# Patient Record
Sex: Female | Born: 1985
Health system: Southern US, Community
[De-identification: ages and names within clinical notes are randomized; demographics above are authoritative.]

## PROBLEM LIST (undated history)

## (undated) DIAGNOSIS — J45909 Unspecified asthma, uncomplicated: Secondary | ICD-10-CM

## (undated) HISTORY — PX: WISDOM TOOTH EXTRACTION: SHX21

---
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
## 2014-06-17 ENCOUNTER — Emergency Department: Payer: Self-pay | Admitting: Emergency Medicine

## 2014-10-14 ENCOUNTER — Emergency Department: Payer: 59

## 2014-10-14 ENCOUNTER — Emergency Department
Admission: EM | Admit: 2014-10-14 | Discharge: 2014-10-14 | Disposition: A | Discharge status: Home / Self Care | Payer: 59 | Attending: Emergency Medicine | Admitting: Emergency Medicine

## 2014-10-14 ENCOUNTER — Encounter: Payer: Self-pay | Admitting: Emergency Medicine

## 2014-10-14 DIAGNOSIS — Z79899 Other long term (current) drug therapy: Secondary | ICD-10-CM | POA: Diagnosis not present

## 2014-10-14 DIAGNOSIS — Z3A01 Less than 8 weeks gestation of pregnancy: Secondary | ICD-10-CM | POA: Insufficient documentation

## 2014-10-14 DIAGNOSIS — O200 Threatened abortion: Secondary | ICD-10-CM | POA: Insufficient documentation

## 2014-10-14 DIAGNOSIS — O209 Hemorrhage in early pregnancy, unspecified: Secondary | ICD-10-CM | POA: Diagnosis present

## 2014-10-14 LAB — URINALYSIS COMPLETE WITH MICROSCOPIC (ARMC ONLY)
BILIRUBIN URINE: NEGATIVE
GLUCOSE, UA: NEGATIVE mg/dL
Ketones, ur: NEGATIVE mg/dL
NITRITE: NEGATIVE
PH: 6 (ref 5.0–8.0)
Protein, ur: NEGATIVE mg/dL
SPECIFIC GRAVITY, URINE: 1.01 (ref 1.005–1.030)

## 2014-10-14 LAB — HCG, QUANTITATIVE, PREGNANCY: HCG, BETA CHAIN, QUANT, S: 26843 m[IU]/mL — AB (ref <5)

## 2014-10-14 NOTE — Discharge Instructions (Signed)
Vaginal Bleeding During Pregnancy, First Trimester  A small amount of bleeding (spotting) from the vagina is relatively common in early pregnancy. It usually stops on its own. Various things may cause bleeding or spotting in early pregnancy. Some bleeding may be related to the pregnancy, and some may not. In most cases, the bleeding is normal and is not a problem. However, bleeding can also be a sign of something serious. Be sure to tell your health care provider about any vaginal bleeding right away.  Some possible causes of vaginal bleeding during the first trimester include:  · Infection or inflammation of the cervix.  · Growths (polyps) on the cervix.  · Miscarriage or threatened miscarriage.  · Pregnancy tissue has developed outside of the uterus and in a fallopian tube (tubal pregnancy).  · Tiny cysts have developed in the uterus instead of pregnancy tissue (molar pregnancy).  HOME CARE INSTRUCTIONS   Watch your condition for any changes. The following actions may help to lessen any discomfort you are feeling:  · Follow your health care provider's instructions for limiting your activity. If your health care provider orders bed rest, you may need to stay in bed and only get up to use the bathroom. However, your health care provider may allow you to continue light activity.  · If needed, make plans for someone to help with your regular activities and responsibilities while you are on bed rest.  · Keep track of the number of pads you use each day, how often you change pads, and how soaked (saturated) they are. Write this down.  · Do not use tampons. Do not douche.  · Do not have sexual intercourse or orgasms until approved by your health care provider.  · If you pass any tissue from your vagina, save the tissue so you can show it to your health care provider.  · Only take over-the-counter or prescription medicines as directed by your health care provider.  · Do not take aspirin because it can make you  bleed.  · Keep all follow-up appointments as directed by your health care provider.  SEEK MEDICAL CARE IF:  · You have any vaginal bleeding during any part of your pregnancy.  · You have cramps or labor pains.  · You have a fever, not controlled by medicine.  SEEK IMMEDIATE MEDICAL CARE IF:   · You have severe cramps in your back or belly (abdomen).  · You pass large clots or tissue from your vagina.  · Your bleeding increases.  · You feel light-headed or weak, or you have fainting episodes.  · You have chills.  · You are leaking fluid or have a gush of fluid from your vagina.  · You pass out while having a bowel movement.  MAKE SURE YOU:  · Understand these instructions.  · Will watch your condition.  · Will get help right away if you are not doing well or get worse.  Document Released: 12/23/2004 Document Revised: 03/20/2013 Document Reviewed: 11/20/2012  ExitCare® Patient Information ©2015 [employer]. This information is not intended to replace advice given to you by your health care provider. Make sure you discuss any questions you have with your health care provider.

## 2014-10-14 NOTE — ED Notes (Signed)
MD at bedside. 

## 2014-10-14 NOTE — ED Notes (Signed)
Says she is 6 or [redacted] weeks pregnant and has had brownish spotting since yesterday.  Had miscarriage in march.

## 2014-10-14 NOTE — ED Provider Notes (Signed)
[facility] Emergency Department Provider Note  ____________________________________________  Time seen: 8:30 PM  I have reviewed the triage vital signs and the nursing notes.   HISTORY  Chief Complaint Vaginal Bleeding     HPI Anna Callahan is a 29 y.o. female presents with history of being approximately 6 or [redacted] weeks pregnant with painless brownish vaginal spotting times one day. Patient is G2 with one previous miscarriage March 2016. Patient denies any pelvic pain no nausea no vomiting and no fever      History reviewed. No pertinent past medical history.  There are no active problems to display for this patient.   No past surgical history on file.  Current Outpatient Rx  Name  Route  Sig  Dispense  Refill  . loratadine (CLARITIN) 10 MG tablet   Oral   Take 10 mg by mouth daily.         . montelukast (SINGULAIR) 10 MG tablet   Oral   Take 10 mg by mouth at bedtime.           Allergies  no known drug allergies   Social History History  Substance Use Topics  . Smoking status: Never Smoker   . Smokeless tobacco: Not on file  . Alcohol Use: No    Review of Systems  Constitutional: Negative for fever. Eyes: Negative for visual changes. ENT: Negative for sore throat. Cardiovascular: Negative for chest pain. Respiratory: Negative for shortness of breath. Gastrointestinal: Negative for abdominal pain, vomiting and diarrhea. Genitourinary: Negative for dysuria. positive for vaginal spotting  Musculoskeletal: Negative for back pain. Skin: Negative for rash. Neurological: Negative for headaches, focal weakness or numbness.   10-point ROS otherwise negative.  ____________________________________________   PHYSICAL EXAM:  VITAL SIGNS: ED Triage Vitals  Enc Vitals Group     BP 10/14/14 1847 117/76 mmHg     Pulse Rate 10/14/14 1847 63     Resp 10/14/14 1847 14     Temp 10/14/14 1847 98.2 F (36.8 C)     Temp Source  10/14/14 1847 Oral     SpO2 10/14/14 1847 99 %     Weight 10/14/14 1847 140 lb (63.504 kg)     Height 10/14/14 1847  (1.575 m)     Head Cir --      Peak Flow --      Pain Score --      Pain Loc --      Pain Edu? --      Excl. in GC? --    Constitutional: Alert and oriented. Well appearing and in no distress. Eyes: Conjunctivae are normal. PERRL. Normal extraocular movements. ENT   Head: Normocephalic and atraumatic.   Nose: No congestion/rhinnorhea.   Mouth/Throat: Mucous membranes are moist.   Neck: No stridor. Hematological/Lymphatic/Immunilogical: No cervical lymphadenopathy. Cardiovascular: Normal rate, regular rhythm. Normal and symmetric distal pulses are present in all extremities. No murmurs, rubs, or gallops. Respiratory: Normal respiratory effort without tachypnea nor retractions. Breath sounds are clear and equal bilaterally. No wheezes/rales/rhonchi. Gastrointestinal: Soft and nontender. No distention. There is no CVA tenderness. Genitourinary: deferred Musculoskeletal: Nontender with normal range of motion in all extremities. No joint effusions.  No lower extremity tenderness nor edema. Neurologic:  Normal speech and language. No gross focal neurologic deficits are appreciated. Speech is normal.  Skin:  Skin is warm, dry and intact. No rash noted. Psychiatric: Mood and affect are normal. Speech and behavior are normal. Patient exhibits appropriate insight and judgment.  ____________________________________________  LABS (pertinent positives/negatives)  Labs Reviewed  HCG, QUANTITATIVE, PREGNANCY - Abnormal; Notable for the following:    hCG, Beta Francene FindersChain, Quant, S [id number] (*)    All other components within normal limits  URINALYSIS COMPLETEWITH MICROSCOPIC ([facility] ONLY) - Abnormal; Notable for the following:    Color, Urine STRAW (*)    APPearance CLEAR (*)    Hgb urine dipstick 2+ (*)    Leukocytes, UA 1+ (*)    Bacteria, UA RARE (*)    Squamous  Epithelial / LPF 0-5 (*)    All other components within normal limits         RADIOLOGY  OB ultrasound revealed:   IMPRESSION: Single live IUP with estimated gestational age 109 weeks 0 days.   Electronically Signed By: Elberta Fortisaniel Boyle M.D. On: 10/14/2014 22:26        INITIAL IMPRESSION / ASSESSMENT AND PLAN / ED COURSE  Pertinent labs & imaging results that were available during my care of the patient were reviewed by me and considered in my medical decision making (see chart for details). history of physical exam concerning for vaginal spotting in the first trimester. Patient advised of possibility of early miscarriage as such with recommendation to follow-up with her GYN as planned on Thursday. ______________________________________   FINAL CLINICAL IMPRESSION(S) / ED DIAGNOSES  Final diagnoses:  Threatened miscarriage in early pregnancy  Vaginal bleeding in pregnancy, first trimester      Darci Currentandolph N Brown, MD 10/14/14 2239

## 2014-10-15 ENCOUNTER — Telehealth: Payer: Self-pay | Admitting: Emergency Medicine

## 2014-10-17 LAB — OB RESULTS CONSOLE GC/CHLAMYDIA
Chlamydia: NEGATIVE
Gonorrhea: NEGATIVE

## 2014-11-27 LAB — OB RESULTS CONSOLE ABO/RH: RH TYPE: POSITIVE

## 2014-11-27 LAB — OB RESULTS CONSOLE HIV ANTIBODY (ROUTINE TESTING): HIV: NONREACTIVE

## 2014-11-27 LAB — OB RESULTS CONSOLE RUBELLA ANTIBODY, IGM: RUBELLA: IMMUNE

## 2014-11-27 LAB — OB RESULTS CONSOLE RPR: RPR: NONREACTIVE

## 2014-11-27 LAB — OB RESULTS CONSOLE ANTIBODY SCREEN: ANTIBODY SCREEN: NEGATIVE

## 2014-11-27 LAB — OB RESULTS CONSOLE HEPATITIS B SURFACE ANTIGEN: Hepatitis B Surface Ag: NEGATIVE

## 2015-01-29 ENCOUNTER — Telehealth: Payer: Self-pay

## 2015-01-29 MED ORDER — MONTELUKAST SODIUM 10 MG PO TABS: 10.0000 mg | ORAL_TABLET | Freq: Every day | ORAL | Status: DC

## 2015-01-29 NOTE — Telephone Encounter (Signed)
Sent in 30 day supply of montelukast 10mg  with no additional refills. Called and left voicemail with patient to return call to notify of refill sent in.

## 2015-01-29 NOTE — Telephone Encounter (Signed)
PATIENT CALLED TO MAKE AN APT AND TO ALSO GET A REFILL ON MONTELUKAST. SHE HAS RAN OUT OF REFILLS AND WOULD LIKE ENOUGH TO HOLD HER OVER TO HER APT. DOL VISIT 10-03-2014 WITH DR HICKS [facility].  PLEASE ADVISE THANKS

## 2015-01-29 NOTE — Telephone Encounter (Signed)
Patient returned phone call and was notified of refill .

## 2015-03-06 ENCOUNTER — Ambulatory Visit: Payer: 59 | Admitting: Allergy and Immunology

## 2015-03-06 ENCOUNTER — Encounter: Payer: Self-pay | Admitting: Allergy and Immunology

## 2015-03-06 VITALS — BP 106/60 | HR 76 | Resp 16

## 2015-03-06 DIAGNOSIS — H1010 Acute atopic conjunctivitis, unspecified eye: Secondary | ICD-10-CM | POA: Diagnosis not present

## 2015-03-06 DIAGNOSIS — J309 Allergic rhinitis, unspecified: Secondary | ICD-10-CM | POA: Diagnosis not present

## 2015-03-06 DIAGNOSIS — J4520 Mild intermittent asthma, uncomplicated: Secondary | ICD-10-CM | POA: Diagnosis not present

## 2015-03-06 MED ORDER — ALBUTEROL SULFATE HFA 108 (90 BASE) MCG/ACT IN AERS: 2.0000 | INHALATION_SPRAY | RESPIRATORY_TRACT | Status: DC | PRN

## 2015-03-06 MED ORDER — MONTELUKAST SODIUM 10 MG PO TABS: 10.0000 mg | ORAL_TABLET | Freq: Every day | ORAL | Status: DC

## 2015-03-06 NOTE — Patient Instructions (Signed)
   Continue Singulair 10 mg each evening.  Continue Claritin 10 mg once daily.  Begin saline nasal wash 2-4 times daily.  As needed ProAir HFA for cough or wheeze 2 puffs every 4-6 hours.  If persisting nasal congestion/PND review with OB adding Rhinocort 1-2 sprays once daily.

## 2015-03-06 NOTE — Progress Notes (Signed)
FOLLOW UP NOTE  RE: Anna Callahan MRN: [id number] DOB: April 26, 1985 [facility] [place] [address] [place] [id number] Date of Office Visit: 03/06/2015  Subjective:  Anna Callahan is a 29 y.o. female who presents today for Medication Refill  Assessment:   1. Allergic rhinoconjunctivitis   2. Mild intermittent asthma.  3.      Pregnancy 27 weeks. Plan:   Meds ordered this encounter  Medications  . montelukast (SINGULAIR) 10 MG tablet    Sig: Take 1 tablet (10 mg total) by mouth at bedtime.    Dispense:  30 tablet    Refill:  5  . albuterol (PROAIR HFA) 108 (90 BASE) MCG/ACT inhaler    Sig: Inhale 2 puffs into the lungs every 4 (four) hours as needed for wheezing or shortness of breath.    Dispense:  1 Inhaler    Refill:  1   Patient Instructions  1.  Continue Singulair 10 mg each evening. 2.  Continue Claritin 10 mg once daily. 3.  Begin saline nasal wash 2-4 times daily. 4.  As needed ProAir HFA for cough or wheeze 2 puffs every 4-6 hours. 5.  If persisting nasal congestion/PND review with OB adding Rhinocort 1-2 sprays once daily. 6.  Follow-up in 4 months or sooner if needed.  HPI: Anna Burtonmily returns to the office in follow-up of allergic rhinoconjunctivitis and previous history of cough and wheeze.  Since her last visit in July she reports doing well with her pregnancy and maintaining on preventive regime, as the OB is in agreement with these medications.  She had an upper respiratory infection approximately 3 weeks ago with significant rhinorrhea, congestion, postnasal drip, slight cough, then she used Sudafed and now improved.  She does note intermittent postnasal drip, but no discolored drainage, headache, sore throat or fever.  She feels her breathing is good and only used [employer] Air once as related to heavy dog exposure.  She is exercising regularly including occasional jogging. Denies ED or urgent care visits,  prednisone or antibiotic courses.  She is requesting refill of medications today given their benefit.  Current Medications: 1.  Singulair 10 mg once daily. 2.  Claritin 10 mg once daily. 3.  ProAir HFA as needed.   Drug Allergies: No Known Allergies  Objective:   Filed Vitals:   03/06/15 1725  BP: 106/60  Pulse: 76  Resp: 16   Physical Exam  Constitutional: She is well-developed, well-nourished, and in no distress.  HENT:  Head: Atraumatic.  Right Ear: Tympanic membrane and ear canal normal.  Left Ear: Tympanic membrane and ear canal normal.  Nose: Mucosal edema present. No rhinorrhea. No epistaxis.  Mouth/Throat: Oropharynx is clear and moist and mucous membranes are normal. No oropharyngeal exudate, posterior oropharyngeal edema or posterior oropharyngeal erythema.  Neck: Neck supple.  Cardiovascular: Normal rate, S1 normal and S2 normal.   No murmur heard. Pulmonary/Chest: Effort normal. She has no wheezes. She has no rhonchi. She has no rales.  Lymphadenopathy:    She has no cervical adenopathy.   Diagnostics: none    Anna Callahan M. Willa RoughHicks, MD  cc:  [facility]

## 2015-03-30 NOTE — L&D Delivery Note (Signed)
Patient was C/C/+1 and pushed for 2hr 30 minutes with epidural.   NSVD  female infant, Apgars 8/9, weight pending.   The patient had a right vaginal laceration repaired with 2-0 vicryl. Fundus was firm. EBL was expected amount. Placenta was delivered intact. Vagina was clear.  Baby was vigorous and doing skin to skin with mother.  Philip AspenALLAHAN, Emmery Seiler

## 2015-04-17 MED FILL — MONTELUKAST SOD 10 MG TAB: 10 | 90 days supply | Qty: 90 | Fill #1

## 2015-05-13 DIAGNOSIS — Z3480 Encounter for supervision of other normal pregnancy, unspecified trimester: Secondary | ICD-10-CM | POA: Diagnosis not present

## 2015-05-13 LAB — OB RESULTS CONSOLE GBS: GBS: POSITIVE

## 2015-06-02 DIAGNOSIS — O368131 Decreased fetal movements, third trimester, fetus 1: Secondary | ICD-10-CM | POA: Diagnosis not present

## 2015-06-07 ENCOUNTER — Encounter (HOSPITAL_COMMUNITY): Payer: Self-pay | Admitting: *Deleted

## 2015-06-07 ENCOUNTER — Inpatient Hospital Stay (HOSPITAL_COMMUNITY)
Admission: AD | Admit: 2015-06-07 | Discharge: 2015-06-10 | DRG: 775 | Disposition: A | Discharge status: Home / Self Care | Payer: 59 | Source: Ambulatory Visit | Attending: Obstetrics and Gynecology | Admitting: Obstetrics and Gynecology

## 2015-06-07 DIAGNOSIS — J45909 Unspecified asthma, uncomplicated: Secondary | ICD-10-CM | POA: Diagnosis present

## 2015-06-07 DIAGNOSIS — Z3A39 39 weeks gestation of pregnancy: Secondary | ICD-10-CM

## 2015-06-07 DIAGNOSIS — O99824 Streptococcus B carrier state complicating childbirth: Principal | ICD-10-CM | POA: Diagnosis present

## 2015-06-07 HISTORY — DX: Unspecified asthma, uncomplicated: J45.909

## 2015-06-07 NOTE — MAU Note (Signed)
Contractions since Tuesday. Yesterday and today about 9mins apart. NOw 4-795mins apart for last several hours. Denies LOF or bleeding.

## 2015-06-08 ENCOUNTER — Inpatient Hospital Stay (HOSPITAL_COMMUNITY): Payer: 59 | Admitting: Anesthesiology

## 2015-06-08 ENCOUNTER — Encounter (HOSPITAL_COMMUNITY): Payer: Self-pay

## 2015-06-08 DIAGNOSIS — O99824 Streptococcus B carrier state complicating childbirth: Secondary | ICD-10-CM | POA: Diagnosis present

## 2015-06-08 DIAGNOSIS — J45909 Unspecified asthma, uncomplicated: Secondary | ICD-10-CM | POA: Diagnosis present

## 2015-06-08 DIAGNOSIS — Z3A39 39 weeks gestation of pregnancy: Secondary | ICD-10-CM | POA: Diagnosis not present

## 2015-06-08 LAB — CBC
HEMATOCRIT: 35.4 % — AB (ref 36.0–46.0)
Hemoglobin: 12 g/dL (ref 12.0–15.0)
MCH: 28.9 pg (ref 26.0–34.0)
MCHC: 33.9 g/dL (ref 30.0–36.0)
MCV: 85.3 fL (ref 78.0–100.0)
Platelets: 244 10*3/uL (ref 150–400)
RBC: 4.15 MIL/uL (ref 3.87–5.11)
RDW: 13.6 % (ref 11.5–15.5)
WBC: 15.1 10*3/uL — AB (ref 4.0–10.5)

## 2015-06-08 LAB — TYPE AND SCREEN
ABO/RH(D): A POS
ANTIBODY SCREEN: NEGATIVE

## 2015-06-08 LAB — RPR: RPR Ser Ql: NONREACTIVE

## 2015-06-08 LAB — ABO/RH: ABO/RH(D): A POS

## 2015-06-08 MED ORDER — LACTATED RINGERS IV SOLN
500.0000 mL | INTRAVENOUS | Status: DC | PRN
Start: 2015-06-08 — End: 2015-06-08

## 2015-06-08 MED ORDER — IBUPROFEN 600 MG PO TABS
600.0000 mg | ORAL_TABLET | Freq: Four times a day (QID) | ORAL | Status: DC
  Administered 2015-06-09 – 2015-06-10 (×6): 600 mg via ORAL
  Filled 2015-06-08 (×6): qty 1

## 2015-06-08 MED ORDER — PHENYLEPHRINE 40 MCG/ML (10ML) SYRINGE FOR IV PUSH (FOR BLOOD PRESSURE SUPPORT)
80.0000 ug | PREFILLED_SYRINGE | INTRAVENOUS | Status: DC | PRN
  Filled 2015-06-08: qty 20
  Filled 2015-06-08: qty 2

## 2015-06-08 MED ORDER — LACTATED RINGERS IV SOLN
INTRAVENOUS | Status: DC
Start: 2015-06-08 — End: 2015-06-08
  Administered 2015-06-08: 500 mL via INTRAVENOUS
  Administered 2015-06-08 (×2): via INTRAVENOUS

## 2015-06-08 MED ORDER — OXYTOCIN 10 UNIT/ML IJ SOLN
2.5000 [IU]/h | INTRAVENOUS | Status: DC
  Filled 2015-06-08: qty 10

## 2015-06-08 MED ORDER — FLEET ENEMA 7-19 GM/118ML RE ENEM: 1.0000 | ENEMA | RECTAL | Status: DC | PRN

## 2015-06-08 MED ORDER — PRENATAL MULTIVITAMIN CH
1.0000 | ORAL_TABLET | Freq: Every day | ORAL | Status: DC
  Administered 2015-06-09: 1 via ORAL
  Filled 2015-06-08: qty 1

## 2015-06-08 MED ORDER — DIPHENHYDRAMINE HCL 25 MG PO CAPS: 25.0000 mg | ORAL_CAPSULE | Freq: Four times a day (QID) | ORAL | Status: DC | PRN

## 2015-06-08 MED ORDER — ACETAMINOPHEN 325 MG PO TABS: 650.0000 mg | ORAL_TABLET | ORAL | Status: DC | PRN

## 2015-06-08 MED ORDER — ONDANSETRON HCL 4 MG/2ML IJ SOLN
4.0000 mg | Freq: Four times a day (QID) | INTRAMUSCULAR | Status: DC | PRN
  Administered 2015-06-08: 4 mg via INTRAVENOUS
  Filled 2015-06-08: qty 2

## 2015-06-08 MED ORDER — OXYCODONE-ACETAMINOPHEN 5-325 MG PO TABS: 1.0000 | ORAL_TABLET | ORAL | Status: DC | PRN

## 2015-06-08 MED ORDER — EPHEDRINE 5 MG/ML INJ
10.0000 mg | INTRAVENOUS | Status: DC | PRN
  Filled 2015-06-08: qty 2

## 2015-06-08 MED ORDER — LACTATED RINGERS IV SOLN: 500.0000 mL | Freq: Once | INTRAVENOUS | Status: DC

## 2015-06-08 MED ORDER — ONDANSETRON HCL 4 MG/2ML IJ SOLN: 4.0000 mg | INTRAMUSCULAR | Status: DC | PRN

## 2015-06-08 MED ORDER — PHENYLEPHRINE 40 MCG/ML (10ML) SYRINGE FOR IV PUSH (FOR BLOOD PRESSURE SUPPORT)
80.0000 ug | PREFILLED_SYRINGE | INTRAVENOUS | Status: DC | PRN
  Filled 2015-06-08: qty 2

## 2015-06-08 MED ORDER — BENZOCAINE-MENTHOL 20-0.5 % EX AERO
1.0000 "application " | INHALATION_SPRAY | CUTANEOUS | Status: DC | PRN
  Administered 2015-06-09: 1 via TOPICAL
  Filled 2015-06-08: qty 56

## 2015-06-08 MED ORDER — PENICILLIN G POTASSIUM 5000000 UNITS IJ SOLR
2.5000 10*6.[IU] | INTRAVENOUS | Status: DC
  Administered 2015-06-08 (×3): 2.5 10*6.[IU] via INTRAVENOUS
  Filled 2015-06-08 (×5): qty 2.5

## 2015-06-08 MED ORDER — PENICILLIN G POTASSIUM 5000000 UNITS IJ SOLR
5.0000 10*6.[IU] | Freq: Once | INTRAVENOUS | Status: AC
  Administered 2015-06-08: 5 10*6.[IU] via INTRAVENOUS
  Filled 2015-06-08: qty 5

## 2015-06-08 MED ORDER — OXYCODONE-ACETAMINOPHEN 5-325 MG PO TABS: 2.0000 | ORAL_TABLET | ORAL | Status: DC | PRN

## 2015-06-08 MED ORDER — LIDOCAINE HCL (PF) 1 % IJ SOLN
30.0000 mL | INTRAMUSCULAR | Status: DC | PRN
  Administered 2015-06-08: 30 mL via SUBCUTANEOUS
  Filled 2015-06-08: qty 30

## 2015-06-08 MED ORDER — OXYTOCIN BOLUS FROM INFUSION
500.0000 mL | INTRAVENOUS | Status: DC
Start: 2015-06-08 — End: 2015-06-08
  Administered 2015-06-08: 500 mL via INTRAVENOUS

## 2015-06-08 MED ORDER — CITRIC ACID-SODIUM CITRATE 334-500 MG/5ML PO SOLN: 30.0000 mL | ORAL | Status: DC | PRN

## 2015-06-08 MED ORDER — ZOLPIDEM TARTRATE 5 MG PO TABS: 5.0000 mg | ORAL_TABLET | Freq: Every evening | ORAL | Status: DC | PRN

## 2015-06-08 MED ORDER — DIBUCAINE 1 % RE OINT
1.0000 "application " | TOPICAL_OINTMENT | RECTAL | Status: DC | PRN
  Administered 2015-06-09: 1 via RECTAL
  Filled 2015-06-08: qty 28

## 2015-06-08 MED ORDER — LIDOCAINE HCL (PF) 1 % IJ SOLN
INTRAMUSCULAR | Status: DC | PRN
  Administered 2015-06-08: 5 mL via EPIDURAL
  Administered 2015-06-08: 3 mL via EPIDURAL
  Administered 2015-06-08: 2 mL via EPIDURAL

## 2015-06-08 MED ORDER — WITCH HAZEL-GLYCERIN EX PADS
1.0000 "application " | MEDICATED_PAD | CUTANEOUS | Status: DC | PRN
  Administered 2015-06-09: 1 via TOPICAL

## 2015-06-08 MED ORDER — SENNOSIDES-DOCUSATE SODIUM 8.6-50 MG PO TABS
2.0000 | ORAL_TABLET | ORAL | Status: DC
  Administered 2015-06-09 (×2): 2 via ORAL
  Filled 2015-06-08 (×2): qty 2

## 2015-06-08 MED ORDER — SIMETHICONE 80 MG PO CHEW: 80.0000 mg | CHEWABLE_TABLET | ORAL | Status: DC | PRN

## 2015-06-08 MED ORDER — ONDANSETRON HCL 4 MG PO TABS: 4.0000 mg | ORAL_TABLET | ORAL | Status: DC | PRN

## 2015-06-08 MED ORDER — TETANUS-DIPHTH-ACELL PERTUSSIS 5-2.5-18.5 LF-MCG/0.5 IM SUSP: 0.5000 mL | Freq: Once | INTRAMUSCULAR | Status: DC

## 2015-06-08 MED ORDER — DIPHENHYDRAMINE HCL 50 MG/ML IJ SOLN: 12.5000 mg | INTRAMUSCULAR | Status: DC | PRN

## 2015-06-08 MED ORDER — FENTANYL 2.5 MCG/ML BUPIVACAINE 1/10 % EPIDURAL INFUSION (WH - ANES)
14.0000 mL/h | INTRAMUSCULAR | Status: DC
  Administered 2015-06-08 (×2): 14 mL/h via EPIDURAL
  Filled 2015-06-08 (×2): qty 125

## 2015-06-08 MED ORDER — LANOLIN HYDROUS EX OINT: TOPICAL_OINTMENT | CUTANEOUS | Status: DC | PRN

## 2015-06-08 NOTE — Anesthesia Preprocedure Evaluation (Signed)
Anesthesia Evaluation  Patient identified by MRN, date of birth, ID band Patient awake    Reviewed: Allergy & Precautions, NPO status , Patient's Chart, lab work & pertinent test results  History of Anesthesia Complications Negative for: history of anesthetic complications  Airway Mallampati: II  TM Distance: >3 FB Neck ROM: Full    Dental  (+) Teeth Intact, Dental Advisory Given   Pulmonary asthma ,    Pulmonary exam normal breath sounds clear to auscultation       Cardiovascular negative cardio ROS Normal cardiovascular exam Rhythm:Regular Rate:Normal     Neuro/Psych negative neurological ROS     GI/Hepatic negative GI ROS, Neg liver ROS,   Endo/Other  Obesity   Renal/GU negative Renal ROS     Musculoskeletal negative musculoskeletal ROS (+)   Abdominal   Peds  Hematology negative hematology ROS (+) Plt 244k   Anesthesia Other Findings Day of surgery medications reviewed with the patient.  Reproductive/Obstetrics (+) Pregnancy                             Anesthesia Physical Anesthesia Plan  ASA: II  Anesthesia Plan: Epidural   Post-op Pain Management:    Induction:   Airway Management Planned:   Additional Equipment:   Intra-op Plan:   Post-operative Plan:   Informed Consent: I have reviewed the patients History and Physical, chart, labs and discussed the procedure including the risks, benefits and alternatives for the proposed anesthesia with the patient or authorized representative who has indicated his/her understanding and acceptance.   Dental advisory given  Plan Discussed with:   Anesthesia Plan Comments: (Patient identified. Risks/Benefits/Options discussed with patient including but not limited to bleeding, infection, nerve damage, paralysis, failed block, incomplete pain control, headache, blood pressure changes, nausea, vomiting, reactions to medication both  or allergic, itching and postpartum back pain. Confirmed with bedside nurse the patient's most recent platelet count. Confirmed with patient that they are not currently taking any anticoagulation, have any bleeding history or any family history of bleeding disorders. Patient expressed understanding and wished to proceed. All questions were answered. )        Anesthesia Quick Evaluation

## 2015-06-08 NOTE — Progress Notes (Signed)
Pt to BS via w/c 

## 2015-06-08 NOTE — Progress Notes (Signed)
Dr Claiborne Billingsallahan notified of pt's admission and status. Aware of ctx pattern, sve, fhr reactive with variables. [facility]

## 2015-06-08 NOTE — Progress Notes (Signed)
Pt up to BR

## 2015-06-08 NOTE — Progress Notes (Signed)
To BS 

## 2015-06-08 NOTE — Anesthesia Procedure Notes (Signed)
Epidural Patient location during procedure: OB  Staffing Anesthesiologist: TURK, STEPHEN EDWARD Performed by: anesthesiologist   Preanesthetic Checklist Completed: patient identified, pre-op evaluation, timeout performed, IV checked, risks and benefits discussed and monitors and equipment checked  Epidural Patient position: sitting Prep: DuraPrep Patient monitoring: blood pressure and continuous pulse ox Approach: midline Location: L3-L4 Injection technique: LOR air  Needle:  Needle type: Tuohy  Needle gauge: 17 G Needle length: 9 cm Needle insertion depth: 6 cm Catheter size: 19 Gauge Catheter at skin depth: 11 cm Test dose: negative and Other (1% Lidocaine)  Additional Notes Patient identified.  Risk benefits discussed including failed block, incomplete pain control, headache, nerve damage, paralysis, blood pressure changes, nausea, vomiting, reactions to medication both toxic or allergic, and postpartum back pain.  Patient expressed understanding and wished to proceed.  All questions were answered.  Sterile technique used throughout procedure and epidural site dressed with sterile barrier dressing. No paresthesia or other complications noted. The patient did not experience any signs of intravascular injection such as tinnitus or metallic taste in mouth nor signs of intrathecal spread such as rapid motor block. Please see nursing notes for vital signs. Reason for block:procedure for pain   

## 2015-06-08 NOTE — H&P (Signed)
30 y.o. 3125w6d  G1P0 comes in c/o labor.  Otherwise has good fetal movement and no bleeding.  Past Medical History  Diagnosis Date  . Asthma     Past Surgical History  Procedure Laterality Date  . No past surgeries    . Wisdom tooth extraction Bilateral     OB History  Gravida Para Term Preterm AB SAB TAB Ectopic Multiple Living  1             # Outcome Date GA Lbr Len/2nd Weight Sex Delivery Anes PTL Lv  1 Current               Social History   Social History  . Marital Status: Married    Spouse Name: N/A  . Number of Children: N/A  . Years of Education: N/A   Occupational History  . Not on file.   Social History Main Topics  . Smoking status: Never Smoker   . Smokeless tobacco: Not on file  . Alcohol Use: No  . Drug Use: No  . Sexual Activity: Not on file   Other Topics Concern  . Not on file   Social History Narrative   Review of patient's allergies indicates no known allergies.    Prenatal Transfer Tool  Maternal Diabetes: No Genetic Screening: Normal, only AFP done Maternal Ultrasounds/Referrals: Normal Fetal Ultrasounds or other Referrals:  None Maternal Substance Abuse:  No Significant Maternal Medications:  None Significant Maternal Lab Results: Lab values include: Group B Strep positive  Other PNC: uncomplicated.    Filed Vitals:   06/08/15 1030 06/08/15 1101  BP: 125/71 112/67  Pulse: 70 81  Temp:    Resp: 17 16     Lungs/Cor:  NAD Abdomen:  soft, gravid Ex:  no cords, erythema SVE:  4.5/100/-2 FHTs:  125, good STV, NST R Toco:  3-7   A/P   Admitted in labor   GBS Pos - PCN  AROM - clear fluid  Will recheck 2 hrs and add pitocin augmentation prn  Larsen Zettel

## 2015-06-08 NOTE — Progress Notes (Signed)
No epidural at this time, ctx getting more frequent FHT 135 Cat 1 TOCO q2-3 SVE: unchanged Discussed with RN can monitor, if ctx space out start pit 2x2 Epidural ok when desired

## 2015-06-09 LAB — CBC
HEMATOCRIT: 32.1 % — AB (ref 36.0–46.0)
HEMOGLOBIN: 10.7 g/dL — AB (ref 12.0–15.0)
MCH: 28.5 pg (ref 26.0–34.0)
MCHC: 33.3 g/dL (ref 30.0–36.0)
MCV: 85.4 fL (ref 78.0–100.0)
Platelets: 224 10*3/uL (ref 150–400)
RBC: 3.76 MIL/uL — ABNORMAL LOW (ref 3.87–5.11)
RDW: 13.9 % (ref 11.5–15.5)
WBC: 20.6 10*3/uL — AB (ref 4.0–10.5)

## 2015-06-09 NOTE — Anesthesia Postprocedure Evaluation (Signed)
Anesthesia Post Note  Patient: Anna Callahan  Procedure(s) Performed: * No procedures listed *  Patient location during evaluation: Mother Baby Anesthesia Type: Epidural Level of consciousness: awake and alert Pain management: pain level controlled Vital Signs Assessment: post-procedure vital signs reviewed and stable Respiratory status: spontaneous breathing, nonlabored ventilation and respiratory function stable Cardiovascular status: stable Postop Assessment: no headache, no backache and epidural receding Anesthetic complications: no    Last Vitals:  Filed Vitals:   06/09/15 0045 06/09/15 0445  BP: 115/57 103/53  Pulse: 87 72  Temp: 37.1 C 37.1 C  Resp: 18 18    Last Pain:  Filed Vitals:   06/09/15 0613  PainSc: 0-No pain                 Diego Delancey

## 2015-06-09 NOTE — Progress Notes (Signed)
Post Partum Day 1 Subjective: no complaints, up ad lib, voiding and tolerating PO  Objective: Blood pressure 103/53, pulse 72, temperature 98.7 F (37.1 C), temperature source Oral, resp. rate 18, height 5\' 3"  (1.6 m), weight 87.998 kg (194 lb), last menstrual period 08/25/2014, SpO2 97 %, unknown if currently breastfeeding.  Physical Exam:  General: alert, cooperative and appears stated age Lochia: appropriate Uterine Fundus: firm   Recent Labs  06/08/15 0100 06/09/15 0546  HGB 12.0 10.7*  HCT 35.4* 32.1*    Assessment/Plan: Plan for discharge tomorrow and Breastfeeding   LOS: 1 day   Ladona Rosten H. 06/09/2015, 9:37 AM

## 2015-06-09 NOTE — Lactation Note (Signed)
This note was copied from a baby's chart. Lactation Consultation Note  Patient Name: Girl Harriet Buttemily Loughridge ZOXWR'UToday's Date: 06/09/2015 Reason for consult: Follow-up assessment Baby at 19 hr of life and mom is still having nipple pain. She thinks the bruising is getting worse. L nipple has a small scab at 2 o'clock and the tip is bruised. The R nipple has a small scab at 10 o'clock and the tip is bruised. Baby has a somewhat high palate, slightly recessed chin, can extend tongue well over gum line, lifts tongue to roof, and has has good lateralization of tongue. Encouraged mom to lead with the chin and follow with the nose when latching. She should keep baby turned to her and tucked up close. FOB was able to help mom pull the lower lip out after mom latched baby. Discussed baby behavior, feeding frequency, pumping, milk storage, baby belly size, voids, wt loss, breast changes, and nipple care. Mom is aware of OP services and support group.     Maternal Data    Feeding Feeding Type: Breast Fed Length of feed: 40 min  LATCH Score/Interventions Latch: Repeated attempts needed to sustain latch, nipple held in mouth throughout feeding, stimulation needed to elicit sucking reflex. Intervention(s): Adjust position;Assist with latch;Breast compression  Audible Swallowing: Spontaneous and intermittent Intervention(s): Skin to skin;Hand expression Intervention(s): Alternate breast massage  Type of Nipple: Everted at rest and after stimulation  Comfort (Breast/Nipple): Filling, red/small blisters or bruises, mild/mod discomfort  Problem noted: Cracked, bleeding, blisters, bruises;Mild/Moderate discomfort Interventions  (Cracked/bleeding/bruising/blister): Expressed breast milk to nipple Interventions (Mild/moderate discomfort): Comfort gels  Hold (Positioning): Assistance needed to correctly position infant at breast and maintain latch. Intervention(s): Support Pillows;Position options  LATCH Score:  7  Lactation Tools Discussed/Used     Consult Status Consult Status: Follow-up Date: 06/10/15 Follow-up type: In-patient    Rulon Eisenmengerlizabeth E Olyvia Gopal 06/09/2015, 4:37 PM

## 2015-06-09 NOTE — Lactation Note (Signed)
This note was copied from a baby's chart. Lactation Consultation Note New mom BF baby in football position when entered rm. RN reported mom's nipples were red, bruised and striped. Baby's face was facing mom but body wasn't and was swaddled in blanket. Unlatched baby which appeared to have a good latch, but cheeks weren't touching mom. Unlatched baby, repositioned baby properly. unswaddled and placed baby STS to mom. Referred to Baby and Me Book in Breastfeeding section Pg. 22-23 for position options and Proper latch demonstration. Mom encouraged to feed baby 8-12 times/24 hours and with feeding cues. Educated about newborn behavior, STS, I&O, cluster feeding, supply and demand. Encouraged comfort during BF so colostrum flows better and mom will enjoy the feeding longer. Taking deep breaths and breast massage during BF. Gave mom shells and encouraged to wear them between feedings in bra in am to assist in everting nipples more. Mom has short shaft nipple that rolls out well in finger tips. Hand pump given to pre-pump as well before latching. Mom is purchasing DEBP before D/c home. WH/LC brochure given w/resources, support groups and LC services. Patient Name: Anna Callahan ZOXWR'UToday's Date: 06/09/2015 Reason for consult: Initial assessment   Maternal Data Has patient been taught Hand Expression?: Yes Does the patient have breastfeeding experience prior to this delivery?: No  Feeding Feeding Type: Breast Fed Length of feed: 25 min  LATCH Score/Interventions Latch: Grasps breast easily, tongue down, lips flanged, rhythmical sucking. Intervention(s): Adjust position;Assist with latch;Breast massage;Breast compression  Audible Swallowing: A few with stimulation Intervention(s): Skin to skin;Hand expression Intervention(s): Alternate breast massage  Type of Nipple: Everted at rest and after stimulation (short shaft)  Comfort (Breast/Nipple): Filling, red/small blisters or bruises, mild/mod  discomfort  Problem noted: Mild/Moderate discomfort Interventions (Mild/moderate discomfort): Comfort gels;Pre-pump if needed;Hand massage;Hand expression  Hold (Positioning): Assistance needed to correctly position infant at breast and maintain latch. Intervention(s): Skin to skin;Position options;Support Pillows;Breastfeeding basics reviewed  LATCH Score: 7  Lactation Tools Discussed/Used Tools: Shells;Pump;Comfort gels Shell Type: Inverted Breast pump type: Manual WIC Program: No Pump Review: Setup, frequency, and cleaning;Milk Storage Initiated by:: L.Shiryl Ruddy RN Date initiated:: 06/09/15   Consult Status Consult Status: Follow-up Date: 06/09/15 (in pm) Follow-up type: In-patient    Karia Ehresman, Diamond NickelLAURA G 06/09/2015, 2:28 AM

## 2015-06-10 NOTE — Progress Notes (Signed)
PPD#@ Pt doing well. Will discharge to home

## 2015-06-10 NOTE — Lactation Note (Signed)
This note was copied from a baby's chart. Lactation Consultation Note  Patient Name: Anna Callahan [id number]'s Date: 06/10/2015 Reason for consult: Follow-up assessment;Breast/nipple pain Mom reports nipple pain with nursing but reports it is getting better than yesterday. Mom wearing shell, using EBM with comfort gels. Worked with Mom on positioning, demonstrated asymmetrical latch. Mom reports pain with initial latch at PS 5-6 improving to 2-3. Reviewed how to bring bottom lip down for more depth. Slight nipple compression visible.  Engorgement care reviewed if needed. OP f/u scheduled for Monday, 06/16/15 at 12:00. Advised not to keep baby on 1 breast for longer than 15-30 minutes, alternate positions with nursing. Work with asymmetrical latch to see if this improves nipple pain. Call for questions/concerns.   Maternal Data    Feeding Feeding Type: Breast Fed Length of feed: 45 min  LATCH Score/Interventions Latch: Grasps breast easily, tongue down, lips flanged, rhythmical sucking. Intervention(s): Adjust position;Assist with latch;Breast massage;Breast compression  Audible Swallowing: A few with stimulation  Type of Nipple: Everted at rest and after stimulation  Comfort (Breast/Nipple): Filling, red/small blisters or bruises, mild/mod discomfort  Problem noted: Filling;Mild/Moderate discomfort Interventions  (Cracked/bleeding/bruising/blister): Expressed breast milk to nipple Interventions (Mild/moderate discomfort): Comfort gels  Hold (Positioning): Assistance needed to correctly position infant at breast and maintain latch. Intervention(s): Breastfeeding basics reviewed;Support Pillows;Position options;Skin to skin  LATCH Score: 7  Lactation Tools Discussed/Used Tools: Comfort gels;Shells Shell Type: Inverted   Consult Status Consult Status: Complete Date: 06/10/15 Follow-up type: In-patient    Alfred LevinsGranger, Dawne Casali Ann 06/10/2015, 10:33 AM

## 2015-06-10 NOTE — Discharge Summary (Signed)
Obstetric Discharge Summary Reason for Admission: onset of labor Prenatal Procedures: ultrasound Intrapartum Procedures: spontaneous vaginal delivery Postpartum Procedures: none Complications-Operative and Postpartum: Vaginal wall lac HEMOGLOBIN  Date Value Ref Range Status  06/09/2015 10.7* 12.0 - 15.0 g/dL Final   HCT  Date Value Ref Range Status  06/09/2015 32.1* 36.0 - 46.0 % Final    Physical Exam:  General: alert Lochia: appropriate Uterine Fundus: firm   Discharge Diagnoses: Term Pregnancy-delivered  Discharge Information: Date: 06/10/2015 Activity: pelvic rest Diet: routine Medications: PNV Condition: stable Instructions: refer to practice specific booklet Discharge to: home Follow-up Information    Follow up with CALLAHAN, SIDNEY, DO. Schedule an appointment as soon as possible for a visit in 1 month.   Specialty:  Obstetrics and Gynecology   Contact information:   [address] [place] [id number] [phone]       Newborn Data: Live born female  Birth Weight: 7 lb 11.8 oz (3510 g) APGAR: 8, 9  Home with mother.  Ellieanna Funderburg E 06/10/2015, 9:01 AM

## 2015-06-11 ENCOUNTER — Ambulatory Visit (HOSPITAL_COMMUNITY)
Admission: RE | Admit: 2015-06-11 | Discharge: 2015-06-11 | Disposition: A | Discharge status: Home / Self Care | Payer: 59 | Source: Ambulatory Visit | Attending: Obstetrics and Gynecology | Admitting: Obstetrics and Gynecology

## 2015-06-11 ENCOUNTER — Telehealth (HOSPITAL_COMMUNITY): Payer: Self-pay | Admitting: Lactation Services

## 2015-06-11 NOTE — Telephone Encounter (Signed)
Mom called concerned about no stools since DC yesterday and wet diapers are not very heavy. Reports nipples are sore. Baby fussy at night. Has appointment with Ped tomorrow. Offered OP appointment here at 4- mom agreeable. Has appointment for Monday but willing to come sooner. Will see this afternoon,

## 2015-06-11 NOTE — Lactation Note (Signed)
Lactation Consult: weight today 6- 13.8 oz  3114 g DC weight yesterday 7-5 Anna Callahan did not transfer any milk from the breast while nursing on both breasts for 40 min total. ? Tongue restriction. Discussed need for supplementation. Discussed methods of supplementation- mom did not want to try feeding tube/syringe- wanted to try cup feeding,.Anna Callahan very fussy, Suggested using curved tip syringe Dad fed Anna Callahan 36 ml and she was very content after feeding. Mom reports this is the most content she has been after feeding. DEBP setup use and cleaning of pump pieces reviewed with parents. Mom pumped for 15 min obtained a few drops. #30 flange given for mom to use at next pumping because she states pump felt tight by the end of pumping. Encouraged to nurse q 2-3 hours for 30 min, pump and supplement with EBM or formula after each feeding. No questions at present. To see Ped tomorrow  Has another OP appointment with [place] on Monday at 12 To call prn  Mother's reason for visit:  DC yesterday, baby has not had a stool since 1 pm yesterday, fussy Visit Type:  Feeding assessment  Consult:  Initial Lactation Consultant:  Pamelia Hoit  ________________________________________________________________________  [name] Name: [name] Date of Birth: 06/08/2016 Pediatrician: Noland Fordyce Gender: female Gestational Age: [redacted]w[redacted]d (At Birth) Birth Weight: 7 lb 11.8 oz (3510 g) Weight at Discharge: Weight: 7 lb 5.3 oz (3325 g)Date of Discharge: 06/10/2015 Filed Weights        ________________________________________________________________________  Mother's Name: [name]  Breastfeeding Experience:  P1  ________________________________________________________________________  Breastfeeding History (Post Discharge)  Frequency of breastfeeding:  q 1-2 hours- baby acts hungry, rooting and smacking all the time Duration of feeding:  45 min or more  Patient does not supplement  or pump.  Infant Intake and Output Assessment  Voids:  5 in 24 hrs.  Color:  Clear yellow mom reports diapers are not very heavy Stools:  Last one at 1 pm yesterday  ________________________________________________________________________  Maternal Breast Assessment  Breast:  Soft Nipple:  Erect, Cracked, Scabs and positional stripes  _______________________________________________________________________ Feeding Assessment/Evaluation  Initial feeding assessment:  Infant's oral assessment: ? Tongue restriction  Positioning:  Football Left breast  LATCH documentation:  Latch:  2 = Grasps breast easily, tongue down, lips flanged, rhythmical sucking.  Audible swallowing:  1 = A few with stimulation- only a few swallows heard  Type of nipple:  2 = Everted at rest and after stimulation  Comfort (Breast/Nipple):  1 = Filling, red/small blisters or bruises, mild/mod discomfort  Hold (Positioning):  1 = Assistance needed to correctly position infant at breast and maintain latch  LATCH score:  7  Attached assessment:  Deep  Lips flanged:  Yes.    Lips untucked:  Yes.    Suck assessment:  Nonnutritive  Pre-feed weight:  3114 g 6- 13.8 oz was 7-5 when left hospital yesterday Post-feed weight:  3114 g 6-13.8 Amount transferred:  0 ml  Anna Callahan latched with assist in football hold. Mom reports pain score of 5 at the beginning of the feeding which then eases to 4. Untucked bottom lip and mom reports that feels some better. Both nipples with positional stripes, cracking and has had some bleeding Anna Callahan did not transfer any milk at the breast    Pre-feed weight:  3114 g 6- 13.8 oz Post-feed weight:  3114 g 6- 13.8 oz Amount transferred:  0 ml Amount supplemented:  36 ml with curved tip syringe dad fed to dad. Reviewed  use and cleaning of tool with parents  Total amount pumped post feed:  R drops    L drops  Total amount transferred:  0 ml Total supplement given:  36 ml

## 2015-06-16 ENCOUNTER — Ambulatory Visit (HOSPITAL_COMMUNITY)
Admit: 2015-06-16 | Discharge: 2015-06-16 | Disposition: A | Discharge status: Home / Self Care | Payer: 59 | Attending: Obstetrics and Gynecology | Admitting: Obstetrics and Gynecology

## 2015-06-16 NOTE — Lactation Note (Signed)
Lactation Consult  Mother's reason for visit: F/U from last weeks LC appt.  Visit Type:  Feeding assessment  Appointment Notes:  Left message to confirm appt. For 3/20  Consult:  Follow-Up Lactation Consultant:  Kathrin GreathouseIorio, Jaquelyn Sakamoto Ann  ________________________________________________________________________  Anna FloresBaby's Name: Anna Callahan Date of Birth: 06/08/2016 Pediatrician: Dr. Noland FordyceLentz  Gender: female Gestational Age: 1249w6d (At Birth) Birth Weight: 7 lb 11.8 oz (3510 g) Weight at Discharge: Weight: 7 lb 5.3 oz (3325 g)[facility]   [facility]15 2127 06/09/15 2358  Weight: 7 lb 11.8 oz (3510 g) 7 lb 5.3 oz (3325 g)   [facility]  Location:Pediatrician's office Weight today:3238 g , 7-2.2 oz      ________________________________________________________________________  Mother's Name: Anna ButteEmily Callahan Type of delivery:   Breastfeeding Experience:  1st baby -  Maternal Medical Conditions:  No risk , exercise induced asthma  Maternal Medications:  PNV   ________________________________________________________________________  Breastfeeding History (Post Discharge)  Frequency of breastfeeding: every 2.5 - 3.5 hours  Duration of feeding:  30 -60 mins   Supplementing: per mom with EBM and enfamil via syringe at  Home   Pumping: per mom DEBP Medela - after 2-3 feedings a day both breast #24 Flange now, and when I was so sore  And healing from sore nipples - [facility] and gradually decreased to #24 and  Now they are comfortable due to my nipples being healed.   Infant Intake and Output Assessment  Voids:  4-5  in 24 hrs.  Color:  Clear yellow Stools:  4-5  in 24 hrs.  Color:  Yellow   ________________________________________________________________________  Maternal Breast Assessment  Breast:  Soft Nipple:  Erect Pain  level:  0 Pain interventions:  Expressed breast milk  _______________________________________________________________________ Feeding Assessment/Evaluation  Initial feeding assessment:  Infant's oral assessment:  Variance high palate - mobility of tongue noted to be WNL   Positioning:  Cross cradle Right breast  LATCH documentation:  Latch:  2 = Grasps breast easily, tongue down, lips flanged, rhythmical sucking.  Audible swallowing:  1 = A few with stimulation  Type of nipple:  2 = Everted at rest and after stimulation  Comfort (Breast/Nipple):  2 = Soft / non-tender  Hold (Positioning):  1 = Assistance needed to correctly position infant at breast and maintain latch  LATCH score:  8  Attached assessment:  Shallow  Lips flanged:  Yes.    Lips untucked:  Yes.    Suck assessment:  Nutritive and Nonnutritive  Tools:  Syringe with 5 Fr feeding tube Instructed on use and cleaning of tool:  Yes.    Pre-feed weight: 3238  g , 7-2.2 oz  Post-feed weight:  3272 g , 7-3.4 oz  Amount transferred:  10 ml  Amount supplemented:  24 ( 74F feeding tube at the breast)  Total Volume 34 ml   Baby only fed on one breast at consult. Mom and dad aware when they get home to breast feed with SNS.   Total amount pumped post feed:  Mom did not post pump   Total amount transferred:  10 ml  Total supplement given:  24 ml  Total volume at feeding: 34 ml ( Breast milk form mom and formula SNS )   Please note baby had last fed 0900-1000 and supplemented - at consult what at 1200.  We woke baby up for weight  check and feeding and it had only been 2 hours since feeding and post pumping.   Lactation Impression:  Low milk supply  Baby  has increased by 2.1 oz since Thursday's weight  ( baby Anna Callahan 15 days old )  Parents over whelmed with syringe feeding for supplementing - @ consult - LC switched  Baby to SNS and showed parents what an active feeding pattern should look like and not non - nutritive  pattern.  ( both mom and dad receptive to the SNS and dad did well inserting the 22F feeding tube after the baby  latched.  Baby responded to the 22F feeding tube at the breast well.    Lactation Plan of Care:  Breast feeding Goals - Anna Callahan increase weight  F/U weight check - next Monday with [facility] per mom  LC recommended F/U to check on milk supply - LC offered mom LC O/P F/U and mom declined at appt.  Feedings - every 2 1/2 - hours , Cluster feedings normal with Growth spurts - 7-10 days , 3 weeks , 6 weeks.  Supplementing - 45-60 ml ( needed with every feeding due to Low Milk supply )  Prepare SNS ( 22F feeding tube with syringes - applied by gradually inserting in baby;s mouth as shown at latch  or Double SNS applied to nipple prior to latch )  Option #1 - feed at the baby at the breast with SNS 1st breast 20 mins , post pump both breast 10  - 20 mins  Option #2 - When Anna Callahan is wide awake - feed both breast dividing supplement between the 2 breast.  Extra pumping - indicated due to low milk supply, after 5-6 feedings a day 10 -20 mins - look for increased fullness and increased volume with post pumping.

## 2015-07-17 MED FILL — LARIN 21 1-20 TABLET: 1-20 | 28 days supply | Qty: 21 | Fill #0

## 2015-08-14 MED FILL — MONTELUKAST SOD 10 MG TAB: 10 | 60 days supply | Qty: 60 | Fill #2

## 2015-08-14 MED FILL — LARIN 21 1-20 TABLET: 1-20 | 28 days supply | Qty: 21 | Fill #1

## 2015-09-11 MED FILL — LARIN 21 1-20 TABLET: 1-20 | 28 days supply | Qty: 21 | Fill #2

## 2015-10-13 MED FILL — LARIN 21 1-20 TABLET: 1-20 | 84 days supply | Qty: 63 | Fill #3

## 2015-11-25 ENCOUNTER — Other Ambulatory Visit: Payer: Self-pay | Admitting: *Deleted

## 2015-11-25 MED ORDER — MONTELUKAST SODIUM 10 MG PO TABS: 10.0000 mg | ORAL_TABLET | Freq: Every day | ORAL | 0 refills | Status: DC

## 2015-11-25 MED FILL — MONTELUKAST SOD 10 MG TAB: 10 | 30 days supply | Qty: 30 | Fill #0

## 2015-12-04 ENCOUNTER — Ambulatory Visit (INDEPENDENT_AMBULATORY_CARE_PROVIDER_SITE_OTHER): Payer: 59 | Admitting: Allergy & Immunology

## 2015-12-04 ENCOUNTER — Encounter (INDEPENDENT_AMBULATORY_CARE_PROVIDER_SITE_OTHER): Payer: Self-pay

## 2015-12-04 ENCOUNTER — Encounter: Payer: Self-pay | Admitting: Allergy & Immunology

## 2015-12-04 VITALS — BP 118/68 | HR 72 | Resp 16 | Ht 62.0 in | Wt 158.3 lb

## 2015-12-04 DIAGNOSIS — J309 Allergic rhinitis, unspecified: Secondary | ICD-10-CM | POA: Diagnosis not present

## 2015-12-04 DIAGNOSIS — H1010 Acute atopic conjunctivitis, unspecified eye: Secondary | ICD-10-CM

## 2015-12-04 DIAGNOSIS — J4520 Mild intermittent asthma, uncomplicated: Secondary | ICD-10-CM

## 2015-12-04 MED ORDER — AZELASTINE-FLUTICASONE 137-50 MCG/ACT NA SUSP: 1.0000 | Freq: Two times a day (BID) | NASAL | 5 refills | Status: DC

## 2015-12-04 MED ORDER — MONTELUKAST SODIUM 10 MG PO TABS: 10.0000 mg | ORAL_TABLET | Freq: Every day | ORAL | 5 refills | Status: DC

## 2015-12-04 NOTE — Progress Notes (Signed)
FOLLOW UP  Date of Service/Encounter:  12/04/15   Assessment:   Allergic rhinoconjunctivitis  Mild intermittent asthma, uncomplicated   Asthma Reportables:  Severity: : intermittent  Risk: low Control: well controlled  Seasonal Influenza Vaccine: yes     Plan/Recommendations:   1. Allergic rhinoconjunctivitis - Continue Singulair 10mg  daily. - Continue Allegra 180mg  daily. - Start Dymista 1 spray per nostril twice daily. - We will send in a script for this.  - Consider allergy shots in the future.   2. Mild intermittent asthma, uncomplicated - Continue Singulair 10mg  daily. - Lung function looked good today. - Although she denies reflux at this time, it could be contributing to her current symptoms. - Consider restarting the Zantac to see if this helps.   3. Return in about 6 months (around 06/02/2016).   Subjective:   Anna Callahan is a 30 y.o. female presenting today for follow up of  Chief Complaint  Patient presents with  . Follow-up  .  Anna Callahan has a history of the following: Patient Active Problem List   Diagnosis Date Noted  . Indication for care in labor or delivery 06/08/2015    History obtained from: chart review and patient.  Anna ButteEmily Newman was referred by No PCP Per Patient.     Anna Callahan is a 30 y.o. female presenting for a follow up visit for asthma and environmental allergies. Anna Callahan was last seen in December 2016 by Dr. Willa RoughHicks, who has since left thepractice. At that time, she was continued on Singulair 10 mg daily as well as albuterol when necessary. She was encouraged to begin nasal saline washes 2-4 times daily. There is discussion about starting Rhinocort 1-2 sprays daily. At that time, she was [redacted] weeks pregnant, therefore there was concern about starting any new medications.   Since last visit, she has done well. She had a baby who is now 39six months old (a beautiful little girl named Anna Callahan). Anna Callahan reports that she did not take the  Singulair for 1-2 weeks (out of refills). She is able to tell a difference when she does not take it for a few days. She is having worsening postnasal drip and congestion. Things are getting better since getting refills of the Singulair (started again last Thursday). She is not on a nasal spray. There was discussion about starting a nasal steroid (Rhinocort). She has used several nasal steroids in the past that have helped somewhat. She is having postnasal drip that worsens prior to bed. She occasionally wakes up with it. She denies reflux at this time although she did have it during the pregnancy.   Anna Callahan has also tried several antihistamines. With Benadryl, she has nightmares. She was on cetirizine which she took for years but then felt that it stopped helping. She switched to fexofenadine 180 mg daily within the last couple years. This medication in conjunction with Singulair has done well to control her allergic rhinitis symptoms - at least until she was pregnant.  Hurley's asthma has been well controlled. She has not required rescue medication, experienced nocturnal awakenings due to lower respiratory symptoms, nor have activities of daily living been limited. She does use albuterol prior to physical activity which does help. If she knows that she is going to be around animals, she will premedicate.   Anna Callahan does not have a PCP, although she knows that she needs to have one. Otherwise, there have been no changes to the past medical history, surgical history, family history, or social history. 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     Review of Systems: a 14-point review of systems is pertinent for what is mentioned in HPI.  Otherwise, all other systems were negative. Constitutional: negative other than that listed in the HPI Eyes: negative other than that listed in the HPI Ears, nose, mouth, throat, and face: negative other than that  listed in the HPI Respiratory: negative other than that listed in the HPI Cardiovascular: negative other than that listed in the HPI Gastrointestinal: negative other than that listed in the HPI Genitourinary: negative other than that listed in the HPI Integument: negative other than that listed in the HPI Hematologic: negative other than that listed in the HPI Musculoskeletal: negative other than that listed in the HPI Neurological: negative other than that listed in the HPI Allergy/Immunologic: negative other than that listed in the HPI    Objective:   Blood pressure 118/68, pulse 72, resp. rate 16, height 5\' 2"  (1.575 m), weight 158 lb 4.6 oz (71.8 kg), unknown if currently breastfeeding. Body mass index is 28.95 kg/m.   Physical Exam:  General: Alert, interactive, in no acute distress. Cooperative with the exam. Very personable.  HEENT: TMs pearly gray, turbinates markedly edematous and pale with clear discharge, post-pharynx erythematous. Neck: Supple without thyromegaly. Lungs: Clear to auscultation without wheezing, rhonchi or rales. No increased work of breathing. CV: Normal S1, S2 without murmurs. Capillary refill <2 seconds.  Skin: Warm and dry, without lesions or rashes. Extremities:  No clubbing, cyanosis or edema. Neuro:   Grossly intact. No focal deficits noted.   Diagnostic studies:  Spirometry: results normal (FEV1: 2.71/96%, FVC: 3.6/110%, FEV1/FVC: 75%).    Spirometry consistent with normal pattern     Anna Bonds, MD [facility] [place] [facility]

## 2015-12-04 NOTE — Patient Instructions (Signed)
1. Allergic rhinoconjunctivitis - Continue Singulair 10mg  daily. - Continue Allegra 180mg  daily. - Start Dymista 1 spray per nostril twice daily. - We will send in a script for this.  - Consider allergy shots in the future.   2. Mild intermittent asthma, uncomplicated - Continue Singulair 10mg  daily. - Lung function looked good today.  3. Return in about 6 months (around 06/02/2016).  Please inform us of any Emergency Department visits, hospitalizations, or changes in symptoms. Call us before going to the ED for breathing or allergy symptoms since we might be able to fit you in for a sick visit. Feel free to contact us anytime with any questions, problems, or concerns.  It was a pleasure to meet you today!

## 2015-12-10 ENCOUNTER — Encounter: Payer: Self-pay | Admitting: *Deleted

## 2015-12-26 DIAGNOSIS — Z6828 Body mass index (BMI) 28.0-28.9, adult: Secondary | ICD-10-CM | POA: Diagnosis not present

## 2015-12-26 DIAGNOSIS — Z01419 Encounter for gynecological examination (general) (routine) without abnormal findings: Secondary | ICD-10-CM | POA: Diagnosis not present

## 2016-01-13 MED FILL — MONTELUKAST SOD 10 MG TAB: 10 | 90 days supply | Qty: 90 | Fill #0

## 2016-03-10 DIAGNOSIS — H527 Unspecified disorder of refraction: Secondary | ICD-10-CM | POA: Diagnosis not present

## 2016-03-29 NOTE — L&D Delivery Note (Signed)
Delivery Note Patient pushed well less than 1/12, At 10:10 PM a viable and healthy female was delivered via Vaginal, Spontaneous Delivery (Presentation LOA).  APGAR: 8, 9; weight pending .   Baby laid on mothers abdomen cried vigorously.  Cord clamping delayed then cut by dad, placenta delivered spontaneously intact 3 vessels No atony.  No lacs  Anesthesia:  Epidural Episiotomy: None Lacerations: None Suture Repair: n/a Est. Blood Loss (mL): 300  Mom to postpartum.  Baby to Couplet care / Skin to Skin.  Essie Hart STACIA 12/27/2016, 11:25 PM

## 2016-05-05 DIAGNOSIS — Z3480 Encounter for supervision of other normal pregnancy, unspecified trimester: Secondary | ICD-10-CM | POA: Diagnosis not present

## 2016-05-05 DIAGNOSIS — Z3201 Encounter for pregnancy test, result positive: Secondary | ICD-10-CM | POA: Diagnosis not present

## 2016-05-05 DIAGNOSIS — N925 Other specified irregular menstruation: Secondary | ICD-10-CM | POA: Diagnosis not present

## 2016-05-12 LAB — OB RESULTS CONSOLE GBS: STREP GROUP B AG: POSITIVE

## 2016-05-12 MED FILL — DICLEGIS DR 10-10 MG TABLET: 10-10 | 30 days supply | Qty: 120 | Fill #0

## 2016-05-12 MED FILL — MONTELUKAST SOD 10 MG TAB: 10 | 90 days supply | Qty: 90 | Fill #1

## 2016-06-03 DIAGNOSIS — Z3682 Encounter for antenatal screening for nuchal translucency: Secondary | ICD-10-CM | POA: Diagnosis not present

## 2016-06-03 DIAGNOSIS — Z363 Encounter for antenatal screening for malformations: Secondary | ICD-10-CM | POA: Diagnosis not present

## 2016-06-03 DIAGNOSIS — O200 Threatened abortion: Secondary | ICD-10-CM | POA: Diagnosis not present

## 2016-06-09 DIAGNOSIS — Z3480 Encounter for supervision of other normal pregnancy, unspecified trimester: Secondary | ICD-10-CM | POA: Diagnosis not present

## 2016-07-27 DIAGNOSIS — Z363 Encounter for antenatal screening for malformations: Secondary | ICD-10-CM | POA: Diagnosis not present

## 2016-07-27 DIAGNOSIS — Z3480 Encounter for supervision of other normal pregnancy, unspecified trimester: Secondary | ICD-10-CM | POA: Diagnosis not present

## 2016-08-24 ENCOUNTER — Telehealth: Payer: Self-pay | Admitting: Allergy & Immunology

## 2016-08-24 ENCOUNTER — Other Ambulatory Visit: Payer: Self-pay

## 2016-08-24 MED ORDER — MONTELUKAST SODIUM 10 MG PO TABS: 10.0000 mg | ORAL_TABLET | Freq: Every day | ORAL | 0 refills | Status: DC

## 2016-08-24 MED FILL — MONTELUKAST SOD 10 MG TAB: 10 | 30 days supply | Qty: 30 | Fill #0

## 2016-08-24 NOTE — Telephone Encounter (Signed)
patient needs refill on [place] [facility] [address]

## 2016-08-24 NOTE — Telephone Encounter (Signed)
Sent in refill

## 2016-09-08 DIAGNOSIS — J019 Acute sinusitis, unspecified: Secondary | ICD-10-CM | POA: Diagnosis not present

## 2016-09-10 ENCOUNTER — Ambulatory Visit (HOSPITAL_COMMUNITY)
Admission: EM | Admit: 2016-09-10 | Discharge: 2016-09-10 | Disposition: A | Discharge status: Home / Self Care | Payer: 59 | Attending: Internal Medicine | Admitting: Internal Medicine

## 2016-09-10 ENCOUNTER — Encounter (HOSPITAL_COMMUNITY): Payer: Self-pay | Admitting: Emergency Medicine

## 2016-09-10 DIAGNOSIS — J320 Chronic maxillary sinusitis: Secondary | ICD-10-CM | POA: Diagnosis not present

## 2016-09-10 MED ORDER — FLUTICASONE PROPIONATE 50 MCG/ACT NA SUSP: 2.0000 | Freq: Every day | NASAL | 0 refills | Status: DC

## 2016-09-10 MED FILL — FLUTICASONE PROP 50 MCG SPR: 50 | 30 days supply | Qty: 16 | Fill #0

## 2016-09-10 NOTE — ED Provider Notes (Signed)
CSN: [id number]     Arrival date & time 09/10/16  1001 History     Chief Complaint  Patient presents with  . Facial Pain    31 yo female who is [redacted] wks pregnant comes in with a 5 day history of sinus pressure/pain. She was seen 2 days ago for the same complaint at another urgent care and was prescribed PCN for it. She states that she has a history of rhinitis and sinus pressure during pregnancies, but the pain is new for her. Pain and pressure is mostly on the left maxillary area. She has also tried infrequent saline rinse, and tylenol for the pain/pressure with minimal relief. Her pain is most significant at night, and has caused her to be restless for the past few nights. She is on Singulair and Claritin for her seasonal allergies. Denies cough, rhinitis, ear pain. Denies fever. Denies dental problems, and was last seen by her dentist 2 months ago.   Patient is being followed by her OB Dr Henderson CloudHorvath.       Past Medical History:  Diagnosis Date  . Asthma    Past Surgical History:  Procedure Laterality Date  . NO PAST SURGERIES    . WISDOM TOOTH EXTRACTION Bilateral    Family History  Problem Relation Age of Onset  . Hyperlipidemia Mother   . Hyperlipidemia Father   . Hyperlipidemia Maternal Grandmother   . Hyperlipidemia Maternal Grandfather    Social History  Substance Use Topics  . Smoking status: Never Smoker  . Smokeless tobacco: Never Used  . Alcohol use No   OB History    Gravida Para Term Preterm AB Living   2 1 1     1    SAB TAB Ectopic Multiple Live Births         0 1     Review of Systems  Constitutional: Negative for chills, diaphoresis and fever.  HENT: Positive for congestion, facial swelling, postnasal drip, rhinorrhea, sinus pain and sinus pressure. Negative for dental problem, ear discharge, ear pain, hearing loss, nosebleeds, sneezing, sore throat and trouble swallowing.   Eyes: Negative for pain, discharge, redness and itching.  Respiratory: Negative for  cough, chest tightness, shortness of breath and wheezing.   Cardiovascular: Negative for chest pain and palpitations.  Allergic/Immunologic: Positive for environmental allergies.    Allergies  Patient has no known allergies.  Home Medications   Prior to Admission medications   Medication Sig Start Date End Date Taking? Authorizing Provider  famotidine (PEPCID) 10 MG tablet Take 10 mg by mouth 2 (two) times daily.   Yes [provider]  Loratadine (CLARITIN) 10 MG CAPS Take by mouth.   Yes [provider]  montelukast (SINGULAIR) 10 MG tablet Take 1 tablet (10 mg total) by mouth at bedtime. 08/24/16  Yes Alfonse SpruceGallagher, Joel Louis, MD  penicillin v potassium (VEETID) 250 MG tablet Take 250 mg by mouth 4 (four) times daily.   Yes [provider]  Prenatal Vit-Fe Fumarate-FA (PRENATAL MULTIVITAMIN) TABS tablet Take 1 tablet by mouth daily at 12 noon.   Yes [provider]  fexofenadine (ALLEGRA) 180 MG tablet Take 180 mg by mouth daily.    [provider]   Meds Ordered and Administered this Visit  Medications - No data to display  BP 101/72 (BP Location: Right Arm)   Pulse 84   Temp 98.2 F (36.8 C) (Oral)   Resp 16   SpO2 97%  No data found.   Physical Exam  Constitutional: She is oriented to person, place, and time. She appears well-developed and well-nourished. No distress.  HENT:  Head: Normocephalic and atraumatic. Head is without right periorbital erythema and without left periorbital erythema.  Right Ear: Tympanic membrane, external ear and ear canal normal.  Left Ear: Tympanic membrane, external ear and ear canal normal.  Nose: No mucosal edema or rhinorrhea. Right sinus exhibits no maxillary sinus tenderness and no frontal sinus tenderness. Left sinus exhibits maxillary sinus tenderness. Left sinus exhibits no frontal sinus tenderness.  Mouth/Throat: Uvula is midline and mucous membranes are normal. Posterior oropharyngeal erythema  present.  Eyes: Conjunctivae are normal. Pupils are equal, round, and reactive to light.  Neck: Normal range of motion. Neck supple.  Cardiovascular: Normal rate and regular rhythm.   Pulmonary/Chest: Effort normal and breath sounds normal.  Lymphadenopathy:    She has no cervical adenopathy.  Neurological: She is alert and oriented to person, place, and time.  Skin: Skin is warm and dry.  Psychiatric: She has a normal mood and affect. Her behavior is normal. Judgment normal.    Urgent Care Course     Procedures (including critical care time)  Labs Review Labs Reviewed - No data to display  Imaging Review No results found.      MDM   1. Chronic maxillary sinusitis    1. Continue PCN until course is finished.  2. Patient pain is significant, discussed addition of Flonase to her current regimen. Patient would like trial of Flonase. I called her OB (Dr Henderson Cloud) office and left message with receptionist to confirm Flonase was safe for the patient to use during her pregnancy. As I did not hear back from her OB office, wrote a prescription of Flonase for the patient, and instructed patient to contact and get approval from her OB before starting Flonase. 3. Patient to continue nasal saline rinse. Discussed with patient the correct use of nasal saline rinse, and to avoid rinse after Flonase use.    Belinda Fisher, PA-C 09/10/16 1125

## 2016-09-10 NOTE — ED Triage Notes (Signed)
[facility] complainto[facility] sinus pain and pressure x 3 days. [facility] and [facility] PCN which has not relieved the symptoms.

## 2016-09-10 NOTE — Discharge Instructions (Signed)
Continue antibiotics until you finish the course. Nasal saline rinses 2-3 times a day. Decrease number of times if nasal passages become too dry.

## 2016-09-22 DIAGNOSIS — Z3480 Encounter for supervision of other normal pregnancy, unspecified trimester: Secondary | ICD-10-CM | POA: Diagnosis not present

## 2016-10-05 ENCOUNTER — Other Ambulatory Visit: Payer: Self-pay | Admitting: Allergy & Immunology

## 2016-10-05 MED ORDER — MONTELUKAST SODIUM 10 MG PO TABS: 10.0000 mg | ORAL_TABLET | Freq: Every day | ORAL | 0 refills | Status: DC

## 2016-10-05 MED FILL — MONTELUKAST SOD 10 MG TAB: 10 | 30 days supply | Qty: 30 | Fill #0

## 2016-10-05 NOTE — Telephone Encounter (Signed)
patient needs a refill on montelukast called into [facility] on [address].

## 2016-10-05 NOTE — Telephone Encounter (Signed)
Left message for patient informing that the prescription has been sent. I also advised in message that she MUST be seen in office before any further refills will be issued.

## 2016-10-12 DIAGNOSIS — Z369 Encounter for antenatal screening, unspecified: Secondary | ICD-10-CM | POA: Diagnosis not present

## 2016-10-12 DIAGNOSIS — Z23 Encounter for immunization: Secondary | ICD-10-CM | POA: Diagnosis not present

## 2016-10-13 ENCOUNTER — Ambulatory Visit (INDEPENDENT_AMBULATORY_CARE_PROVIDER_SITE_OTHER): Payer: 59 | Admitting: Allergy & Immunology

## 2016-10-13 DIAGNOSIS — Z3490 Encounter for supervision of normal pregnancy, unspecified, unspecified trimester: Secondary | ICD-10-CM

## 2016-10-13 DIAGNOSIS — J302 Other seasonal allergic rhinitis: Secondary | ICD-10-CM | POA: Diagnosis not present

## 2016-10-13 DIAGNOSIS — J4521 Mild intermittent asthma with (acute) exacerbation: Secondary | ICD-10-CM

## 2016-10-13 MED ORDER — MONTELUKAST SODIUM 10 MG PO TABS: 10.0000 mg | ORAL_TABLET | Freq: Every day | ORAL | 5 refills | Status: DC

## 2016-10-13 MED ORDER — ALBUTEROL SULFATE HFA 108 (90 BASE) MCG/ACT IN AERS: 2.0000 | INHALATION_SPRAY | RESPIRATORY_TRACT | 1 refills | Status: DC | PRN

## 2016-10-13 NOTE — Patient Instructions (Addendum)
1. Allergic rhinoconjunctivitis - Continue Singulair 10mg  daily. - Continue Allegra 180mg  daily. - Continue with your nasal steroid as needed.   2. Mild intermittent asthma - with acute exacerbation - Lung testing showed evidence of obstructive disease (i.e. Asthma) - You did improve with albuterol nebulizer treatment. - Start Pulmicort 90mcg two puffs twice daily for 1-2 weeks while you recover from your illness. - If you are not getting better by Friday or over the weekend, shot me an email and we can send in an antibiotic.  - Email me ([email or web address]) or call me - [phone].  3. Return in about 6 months (around 04/15/2017).  Please inform us of any Emergency Department visits, hospitalizations, or changes in symptoms. Call us before going to the ED for breathing or allergy symptoms since we might be able to fit you in for a sick visit. Feel free to contact us anytime with any questions, problems, or concerns.  Congratulations on the pregnancy and best wishes for a safe delivery! It was lovely to meet Bishopharlotte today!

## 2016-10-13 NOTE — Progress Notes (Signed)
FOLLOW UP  Date of Service/Encounter:  10/13/16   Assessment:   Mild intermittent asthma with acute exacerbation  Seasonal allergic rhinitis  Pregnancy    Asthma Reportables:  Severity: intermittent  Risk: high Control: not well controlled    Plan/Recommendations:   1. Allergic rhinoconjunctivitis - Continue Singulair 10mg  daily. - Continue Allegra 180mg  daily. - Continue with your nasal steroid as needed.   2. Mild intermittent asthma - with acute exacerbation - Lung testing showed evidence of obstructive disease (i.e. asthma) - You did improve with albuterol nebulizer treatment. - Start Pulmicort 90mcg two puffs twice daily for 1-2 weeks while you recover from your illness. - We did discuss starting systemic steroids, but given the risks of prednisone on a developing fetus, we will hold off for now. - Patient in agreement with this decision.  - If you are not getting better by Friday or over the weekend, shot me an email and we can send in an antibiotic (likely azithromycin given her recent treatment with penicillin).  - Email me ([email or web address]) or call me on my work cell [phone]).  3. Return in about 6 months (around 04/15/2017).    Subjective:   Anna Callahan is a 31 y.o. female presenting today for follow up of  Chief Complaint  Patient presents with  . Allergic Rhinitis     Anna Buttemily Neaves has a history of the following: Patient Active Problem List   Diagnosis Date Noted  . Mild intermittent asthma with acute exacerbation 10/13/2016  . Other seasonal allergic rhinitis 10/13/2016  . Indication for care in labor or delivery 06/08/2015    History obtained from: chart review and patient.  Anna ButteEmily Wadsworth was referred by Patient, No Pcp Per.     [name] is a 31 y.o. female presenting for a follow up visit. She was last seen in September 2017. At that time, we continued her on Singulair 10 mg daily, Allegra 180 mg daily. We started  Dymista one spray per nostril twice daily. We continued the Singulair alone for control of her asthma. She was having some breakthrough symptoms and we discussed the idea of uncontrolled reflux as a cause of worsening asthma control. We deferred initiating a reflux medication.  Since the last visit, she has mostly done well. She is now pregnant and due at the end of September. Over the last couple of weeks, her asthma has worsened. She has used her ProAir three times in the last week. She is only on Singulair 10mg  at night. She has not needed prednisone at all. She did have a recent course of penicillin for seven days for a sinus infection. She did not have breathing problems during the sinus infection, but the shortness of breath actually worsened a few weeks after the sinus infection. She feels that this is a different infection since it has been a few weeks since the penicillin was given. Albuterol does seem to make the symptoms less severe.   She was on Flonase during the sinus infection. She did use the Dymista for a short period of time, but overall she tends to not use any nasal sprays. She is using Allegra daily.   Otherwise, there have been no changes to her past medical history, surgical history, family history, or social history. [facility] is [facility] She did bring her daughter Claris GowerCharlotte with her today.     Review of Systems: a 14-point review of systems is pertinent for what is mentioned in  HPI.  Otherwise, all other systems were negative. Constitutional: negative other than that listed in the HPI Eyes: negative other than that listed in the HPI Ears, nose, mouth, throat, and face: negative other than that listed in the HPI Respiratory: negative other than that listed in the HPI Cardiovascular: negative other than that listed in the HPI Gastrointestinal: negative other than that listed in the HPI Genitourinary: negative other than that  listed in the HPI Integument: negative other than that listed in the HPI Hematologic: negative other than that listed in the HPI Musculoskeletal: negative other than that listed in the HPI Neurological: negative other than that listed in the HPI Allergy/Immunologic: negative other than that listed in the HPI    Objective:   unknown if currently breastfeeding. There is no height or weight on file to calculate BMI.   Physical Exam:  General: Alert, interactive, in no acute distress. Very pleasant.  Eyes: No conjunctival injection present on the right, No conjunctival injection present on the left, PERRL bilaterally, No discharge on the right, No discharge on the left and No Horner-Trantas dots present Ears: Right TM pearly gray with normal light reflex, Left TM pearly gray with normal light reflex, Right TM intact without perforation and Left TM intact without perforation.  Nose/Throat: External nose within normal limits and septum midline, turbinates edematous and pale with clear discharge, post-pharynx erythematous without cobblestoning in the posterior oropharynx. Tonsils 2+ without exudates Neck: Supple without thyromegaly. Lungs: Coarse upper airway noise throughout with expiratory wheezing in the left lung fields. No increased work of breathing. CV: Normal S1/S2, no murmurs. Capillary refill <2 seconds.  Skin: Warm and dry, without lesions or rashes. Neuro:   Grossly intact. No focal deficits appreciated. Responsive to questions.   Diagnostic studies:   Spirometry: results abnormal (FEV1: 2.02/68%, FVC: 2.96/85%, FEV1/FVC: 68%).    Spirometry consistent with mild obstructive disease. Albuterol/Atrovent nebulizer treatment given in clinic with significant improvement. There was a 50% improvement in the forced vital capacity and an 18% improvement in the FEV1. Her FEV1 to FVC ratio normalized following the treatment.  Allergy Studies: none      [name], MD  [facility]

## 2016-10-14 NOTE — Addendum Note (Signed)
Addended by: Alfonse SpruceGALLAGHER, Latice Waitman LOUIS on: 10/14/2016 11:12 PM   Modules accepted: Orders

## 2016-10-14 NOTE — Progress Notes (Signed)
I received a call from Ms. Anna Callahan letting me know that she is not feeling better despite the addition of Pulmicort. Therefore I will send in a prescription for Augmentin 875mg  BID for two weeks. Patient notified with the plan.  Anna BondsJoel Noemie Devivo, MD FAAAAI [facility]

## 2016-10-15 MED ORDER — AMOXICILLIN-POT CLAVULANATE 875-125 MG PO TABS: 1.0000 | ORAL_TABLET | Freq: Two times a day (BID) | ORAL | 0 refills | Status: AC

## 2016-10-15 NOTE — Addendum Note (Signed)
Addended by: Alfonse SpruceGALLAGHER, Natoria Archibald LOUIS on: 10/15/2016 07:09 AM   Modules accepted: Orders

## 2016-10-16 LAB — OB RESULTS CONSOLE GC/CHLAMYDIA
Chlamydia: NEGATIVE
Gonorrhea: NEGATIVE

## 2016-11-17 DIAGNOSIS — Z369 Encounter for antenatal screening, unspecified: Secondary | ICD-10-CM | POA: Diagnosis not present

## 2016-11-17 DIAGNOSIS — Z3480 Encounter for supervision of other normal pregnancy, unspecified trimester: Secondary | ICD-10-CM | POA: Diagnosis not present

## 2016-11-26 LAB — OB RESULTS CONSOLE RPR: RPR: NONREACTIVE

## 2016-11-26 LAB — OB RESULTS CONSOLE HEPATITIS B SURFACE ANTIGEN: Hepatitis B Surface Ag: NEGATIVE

## 2016-11-26 LAB — OB RESULTS CONSOLE RUBELLA ANTIBODY, IGM: Rubella: IMMUNE

## 2016-11-26 LAB — OB RESULTS CONSOLE HIV ANTIBODY (ROUTINE TESTING): HIV: NONREACTIVE

## 2016-12-27 ENCOUNTER — Inpatient Hospital Stay (HOSPITAL_COMMUNITY): Payer: 59 | Admitting: Anesthesiology

## 2016-12-27 ENCOUNTER — Inpatient Hospital Stay (HOSPITAL_COMMUNITY)
Admission: AD | Admit: 2016-12-27 | Discharge: 2016-12-29 | DRG: 807 | Disposition: A | Discharge status: Home / Self Care | Payer: 59 | Source: Ambulatory Visit | Attending: Obstetrics & Gynecology | Admitting: Obstetrics & Gynecology

## 2016-12-27 ENCOUNTER — Encounter (HOSPITAL_COMMUNITY): Payer: Self-pay | Admitting: *Deleted

## 2016-12-27 DIAGNOSIS — Z3A40 40 weeks gestation of pregnancy: Secondary | ICD-10-CM

## 2016-12-27 DIAGNOSIS — Z23 Encounter for immunization: Secondary | ICD-10-CM

## 2016-12-27 DIAGNOSIS — O26893 Other specified pregnancy related conditions, third trimester: Secondary | ICD-10-CM | POA: Diagnosis present

## 2016-12-27 DIAGNOSIS — O99824 Streptococcus B carrier state complicating childbirth: Secondary | ICD-10-CM | POA: Diagnosis present

## 2016-12-27 LAB — CBC
HEMATOCRIT: 35.9 % — AB (ref 36.0–46.0)
HEMOGLOBIN: 11.9 g/dL — AB (ref 12.0–15.0)
MCH: 26.4 pg (ref 26.0–34.0)
MCHC: 33.1 g/dL (ref 30.0–36.0)
MCV: 79.8 fL (ref 78.0–100.0)
Platelets: 271 10*3/uL (ref 150–400)
RBC: 4.5 MIL/uL (ref 3.87–5.11)
RDW: 14.2 % (ref 11.5–15.5)
WBC: 13.3 10*3/uL — AB (ref 4.0–10.5)

## 2016-12-27 LAB — TYPE AND SCREEN
ABO/RH(D): A POS
ANTIBODY SCREEN: NEGATIVE

## 2016-12-27 MED ORDER — IBUPROFEN 600 MG PO TABS
600.0000 mg | ORAL_TABLET | Freq: Four times a day (QID) | ORAL | Status: DC
  Administered 2016-12-27 – 2016-12-29 (×7): 600 mg via ORAL
  Filled 2016-12-27 (×7): qty 1

## 2016-12-27 MED ORDER — PHENYLEPHRINE 40 MCG/ML (10ML) SYRINGE FOR IV PUSH (FOR BLOOD PRESSURE SUPPORT)
80.0000 ug | PREFILLED_SYRINGE | INTRAVENOUS | Status: DC | PRN
  Filled 2016-12-27: qty 5

## 2016-12-27 MED ORDER — OXYTOCIN 40 UNITS IN LACTATED RINGERS INFUSION - SIMPLE MED
2.5000 [IU]/h | INTRAVENOUS | Status: DC
  Filled 2016-12-27: qty 1000

## 2016-12-27 MED ORDER — ACETAMINOPHEN 325 MG PO TABS: 650.0000 mg | ORAL_TABLET | ORAL | Status: DC | PRN

## 2016-12-27 MED ORDER — ONDANSETRON HCL 4 MG/2ML IJ SOLN
4.0000 mg | Freq: Four times a day (QID) | INTRAMUSCULAR | Status: DC | PRN
  Administered 2016-12-27: 4 mg via INTRAVENOUS
  Filled 2016-12-27: qty 2

## 2016-12-27 MED ORDER — LACTATED RINGERS IV SOLN: 500.0000 mL | INTRAVENOUS | Status: DC | PRN

## 2016-12-27 MED ORDER — LACTATED RINGERS IV SOLN
INTRAVENOUS | Status: DC
  Administered 2016-12-27: 17:00:00 via INTRAVENOUS

## 2016-12-27 MED ORDER — OXYTOCIN BOLUS FROM INFUSION
500.0000 mL | Freq: Once | INTRAVENOUS | Status: AC
  Administered 2016-12-27: 500 mL via INTRAVENOUS

## 2016-12-27 MED ORDER — FENTANYL 2.5 MCG/ML BUPIVACAINE 1/10 % EPIDURAL INFUSION (WH - ANES)
INTRAMUSCULAR | Status: AC
  Filled 2016-12-27: qty 100

## 2016-12-27 MED ORDER — FLEET ENEMA 7-19 GM/118ML RE ENEM: 1.0000 | ENEMA | RECTAL | Status: DC | PRN

## 2016-12-27 MED ORDER — DIPHENHYDRAMINE HCL 50 MG/ML IJ SOLN: 12.5000 mg | INTRAMUSCULAR | Status: DC | PRN

## 2016-12-27 MED ORDER — OXYTOCIN 40 UNITS IN LACTATED RINGERS INFUSION - SIMPLE MED
1.0000 m[IU]/min | INTRAVENOUS | Status: DC
  Administered 2016-12-27: 2 m[IU]/min via INTRAVENOUS

## 2016-12-27 MED ORDER — LACTATED RINGERS IV SOLN
500.0000 mL | Freq: Once | INTRAVENOUS | Status: AC
  Administered 2016-12-27: 500 mL via INTRAVENOUS

## 2016-12-27 MED ORDER — PENICILLIN G POT IN DEXTROSE 60000 UNIT/ML IV SOLN
3.0000 10*6.[IU] | INTRAVENOUS | Status: DC
  Administered 2016-12-27: 3 10*6.[IU] via INTRAVENOUS
  Filled 2016-12-27 (×5): qty 50

## 2016-12-27 MED ORDER — TERBUTALINE SULFATE 1 MG/ML IJ SOLN
0.2500 mg | Freq: Once | INTRAMUSCULAR | Status: DC | PRN
  Filled 2016-12-27: qty 1

## 2016-12-27 MED ORDER — EPHEDRINE 5 MG/ML INJ
10.0000 mg | INTRAVENOUS | Status: DC | PRN
  Filled 2016-12-27: qty 2

## 2016-12-27 MED ORDER — LIDOCAINE HCL (PF) 1 % IJ SOLN
30.0000 mL | INTRAMUSCULAR | Status: DC | PRN
  Filled 2016-12-27: qty 30

## 2016-12-27 MED ORDER — OXYCODONE-ACETAMINOPHEN 5-325 MG PO TABS: 1.0000 | ORAL_TABLET | ORAL | Status: DC | PRN

## 2016-12-27 MED ORDER — SOD CITRATE-CITRIC ACID 500-334 MG/5ML PO SOLN: 30.0000 mL | ORAL | Status: DC | PRN

## 2016-12-27 MED ORDER — LIDOCAINE HCL (PF) 1 % IJ SOLN
INTRAMUSCULAR | Status: DC | PRN
  Administered 2016-12-27 (×2): 5 mL

## 2016-12-27 MED ORDER — PENICILLIN G POTASSIUM 5000000 UNITS IJ SOLR
5.0000 10*6.[IU] | Freq: Once | INTRAVENOUS | Status: AC
  Administered 2016-12-27: 5 10*6.[IU] via INTRAVENOUS
  Filled 2016-12-27: qty 5

## 2016-12-27 MED ORDER — FENTANYL 2.5 MCG/ML BUPIVACAINE 1/10 % EPIDURAL INFUSION (WH - ANES)
14.0000 mL/h | INTRAMUSCULAR | Status: DC | PRN
  Administered 2016-12-27 (×2): 14 mL/h via EPIDURAL

## 2016-12-27 MED ORDER — PHENYLEPHRINE 40 MCG/ML (10ML) SYRINGE FOR IV PUSH (FOR BLOOD PRESSURE SUPPORT)
PREFILLED_SYRINGE | INTRAVENOUS | Status: AC
  Filled 2016-12-27: qty 20

## 2016-12-27 MED ORDER — OXYCODONE-ACETAMINOPHEN 5-325 MG PO TABS: 2.0000 | ORAL_TABLET | ORAL | Status: DC | PRN

## 2016-12-27 NOTE — Anesthesia Pain Management Evaluation Note (Signed)
  CRNA Pain Management Visit Note  Patient: Anna Callahan, 31 y.o., female  "Hello I am a member of the anesthesia team at [facility]. We have an anesthesia team available at all times to provide care throughout the hospital, including epidural management and anesthesia for C-section. I don't know your plan for the delivery whether it a natural birth, water birth, IV sedation, nitrous supplementation, doula or epidural, but we want to meet your pain goals."   1.Was your pain managed to your expectations on prior hospitalizations?   Yes   2.What is your expectation for pain management during this hospitalization?     Epidural  3.How can we help you reach that goal? unsure  Record the patient's initial score and the patient's pain goal.   Pain: 2  Pain Goal: 8 [facility] wants you to be able to say your pain was always managed very well.  Anna Callahan 12/27/2016

## 2016-12-27 NOTE — MAU Note (Signed)
C/o ctx for the past 2-3 days. Got stronger and closer together today.

## 2016-12-27 NOTE — Anesthesia Preprocedure Evaluation (Signed)
Anesthesia Evaluation  Patient identified by MRN, date of birth, ID band Patient awake    Reviewed: Allergy & Precautions, H&P , NPO status , Patient's Chart, lab work & pertinent test results  History of Anesthesia Complications Negative for: history of anesthetic complications  Airway Mallampati: II  TM Distance: >3 FB Neck ROM: full    Dental no notable dental hx. (+) Teeth Intact   Pulmonary asthma ,    Pulmonary exam normal breath sounds clear to auscultation       Cardiovascular negative cardio ROS Normal cardiovascular exam Rhythm:regular Rate:Normal     Neuro/Psych negative neurological ROS  negative psych ROS   GI/Hepatic negative GI ROS, Neg liver ROS,   Endo/Other  negative endocrine ROS  Renal/GU negative Renal ROS  negative genitourinary   Musculoskeletal   Abdominal   Peds  Hematology negative hematology ROS (+)   Anesthesia Other Findings   Reproductive/Obstetrics (+) Pregnancy                             Anesthesia Physical Anesthesia Plan  ASA: II  Anesthesia Plan: Epidural   Post-op Pain Management:    Induction:   PONV Risk Score and Plan:   Airway Management Planned:   Additional Equipment:   Intra-op Plan:   Post-operative Plan:   Informed Consent: I have reviewed the patients History and Physical, chart, labs and discussed the procedure including the risks, benefits and alternatives for the proposed anesthesia with the patient or authorized representative who has indicated his/her understanding and acceptance.     Plan Discussed with:   Anesthesia Plan Comments:         Anesthesia Quick Evaluation  

## 2016-12-27 NOTE — Anesthesia Procedure Notes (Signed)
Epidural Patient location during procedure: OB  Staffing Anesthesiologist: Silvio Sausedo Performed: anesthesiologist   Preanesthetic Checklist Completed: patient identified, site marked, surgical consent, pre-op evaluation, timeout performed, IV checked, risks and benefits discussed and monitors and equipment checked  Epidural Patient position: sitting Prep: DuraPrep Patient monitoring: heart rate, continuous pulse ox and blood pressure Approach: right paramedian Location: L3-L4 Injection technique: LOR saline  Needle:  Needle type: Tuohy  Needle gauge: 17 G Needle length: 9 cm and 9 Needle insertion depth: 6 cm Catheter type: closed end flexible Catheter size: 20 Guage Catheter at skin depth: 10 cm Test dose: negative  Assessment Events: blood not aspirated, injection not painful, no injection resistance, negative IV test and no paresthesia  Additional Notes Patient identified. Risks/Benefits/Options discussed with patient including but not limited to bleeding, infection, nerve damage, paralysis, failed block, incomplete pain control, headache, blood pressure changes, nausea, vomiting, reactions to medication both or allergic, itching and postpartum back pain. Confirmed with bedside nurse the patient's most recent platelet count. Confirmed with patient that they are not currently taking any anticoagulation, have any bleeding history or any family history of bleeding disorders. Patient expressed understanding and wished to proceed. All questions were answered. Sterile technique was used throughout the entire procedure. Please see nursing notes for vital signs. Test dose was given through epidural needle and negative prior to continuing to dose epidural or start infusion. Warning signs of high block given to the patient including shortness of breath, tingling/numbness in hands, complete motor block, or any concerning symptoms with instructions to call for help. Patient was given  instructions on fall risk and not to get out of bed. All questions and concerns addressed with instructions to call with any issues.     

## 2016-12-27 NOTE — H&P (Signed)
Anna Callahan is a 31 y.o. female presenting for painful regular contractions  OB History    Gravida Para Term Preterm AB Living   SAB TAB Ectopic Multiple Live Births         0 1     Past Medical History:  Diagnosis Date  . Asthma    Past Surgical History:  Procedure Laterality Date  . NO PAST SURGERIES    . WISDOM TOOTH EXTRACTION Bilateral    Family History: family history includes Hyperlipidemia in her father, maternal grandfather, maternal grandmother, and mother. Social History:  reports that she has never smoked. She has never used smokeless tobacco. She reports that she does not drink alcohol or use drugs.     Maternal Diabetes: No Genetic Screening: Normal Maternal Ultrasounds/Referrals: Normal Fetal Ultrasounds or other Referrals:  None Maternal Substance Abuse:  No Significant Maternal Medications:  None Significant Maternal Lab Results:  Lab values include: Group B Strep positive Other Comments:  None  ROS Maternal Medical History:  Contractions: Frequency: irregular.   Perceived severity is strong.    Fetal activity: Perceived fetal activity is normal.    Prenatal complications: no prenatal complications Prenatal Complications - Diabetes: none.    Dilation: 10 Effacement (%): 100 Station: 0 Exam by:: Dr. Mora Appl Blood pressure (!) 112/58, pulse 85, temperature 98.6 F (37 C), temperature source Oral, resp. rate 16, height  (1.575 m), weight 86.2 kg (190 lb), unknown if currently breastfeeding. Exam Physical Exam  Prenatal labs: ABO, Rh: --/--/A POS (10/01 1653) Antibody: NEG (10/01 1653) Rubella: @ RPR: Nonreactive (08/31 0000)  HBsAg: Negative (08/31 0000)  HIV: Non-reactive (08/31 0000)  GBS: Positive (02/14 0000)   Assessment/Plan: Patient in active labor  Continuous monitoring Epidural on demand Anticipate svd   Anna Callahan STACIA 12/27/2016, 11:24 PM

## 2016-12-28 LAB — RPR: RPR: NONREACTIVE

## 2016-12-28 LAB — CBC
HEMATOCRIT: 32.2 % — AB (ref 36.0–46.0)
Hemoglobin: 10.6 g/dL — ABNORMAL LOW (ref 12.0–15.0)
MCH: 26.4 pg (ref 26.0–34.0)
MCHC: 32.9 g/dL (ref 30.0–36.0)
MCV: 80.3 fL (ref 78.0–100.0)
Platelets: 227 10*3/uL (ref 150–400)
RBC: 4.01 MIL/uL (ref 3.87–5.11)
RDW: 14.3 % (ref 11.5–15.5)
WBC: 16.1 10*3/uL — ABNORMAL HIGH (ref 4.0–10.5)

## 2016-12-28 MED ORDER — ONDANSETRON HCL 4 MG PO TABS: 4.0000 mg | ORAL_TABLET | ORAL | Status: DC | PRN

## 2016-12-28 MED ORDER — ZOLPIDEM TARTRATE 5 MG PO TABS: 5.0000 mg | ORAL_TABLET | Freq: Every evening | ORAL | Status: DC | PRN

## 2016-12-28 MED ORDER — DIBUCAINE 1 % RE OINT: 1.0000 "application " | TOPICAL_OINTMENT | RECTAL | Status: DC | PRN

## 2016-12-28 MED ORDER — OXYCODONE-ACETAMINOPHEN 5-325 MG PO TABS: 1.0000 | ORAL_TABLET | ORAL | Status: DC | PRN

## 2016-12-28 MED ORDER — ONDANSETRON HCL 4 MG/2ML IJ SOLN: 4.0000 mg | INTRAMUSCULAR | Status: DC | PRN

## 2016-12-28 MED ORDER — COCONUT OIL OIL: 1.0000 "application " | TOPICAL_OIL | Status: DC | PRN

## 2016-12-28 MED ORDER — PRENATAL MULTIVITAMIN CH
1.0000 | ORAL_TABLET | Freq: Every day | ORAL | Status: DC
  Administered 2016-12-28 – 2016-12-29 (×2): 1 via ORAL
  Filled 2016-12-28 (×2): qty 1

## 2016-12-28 MED ORDER — DIPHENHYDRAMINE HCL 25 MG PO CAPS: 25.0000 mg | ORAL_CAPSULE | Freq: Four times a day (QID) | ORAL | Status: DC | PRN

## 2016-12-28 MED ORDER — INFLUENZA VAC SPLIT QUAD 0.5 ML IM SUSY
0.5000 mL | PREFILLED_SYRINGE | INTRAMUSCULAR | Status: AC
  Administered 2016-12-29: 0.5 mL via INTRAMUSCULAR
  Filled 2016-12-28: qty 0.5

## 2016-12-28 MED ORDER — WITCH HAZEL-GLYCERIN EX PADS: 1.0000 "application " | MEDICATED_PAD | CUTANEOUS | Status: DC | PRN

## 2016-12-28 MED ORDER — SENNOSIDES-DOCUSATE SODIUM 8.6-50 MG PO TABS
2.0000 | ORAL_TABLET | ORAL | Status: DC
  Administered 2016-12-28 – 2016-12-29 (×2): 2 via ORAL
  Filled 2016-12-28 (×2): qty 2

## 2016-12-28 MED ORDER — SIMETHICONE 80 MG PO CHEW: 80.0000 mg | CHEWABLE_TABLET | ORAL | Status: DC | PRN

## 2016-12-28 MED ORDER — ACETAMINOPHEN 325 MG PO TABS: 650.0000 mg | ORAL_TABLET | ORAL | Status: DC | PRN

## 2016-12-28 MED ORDER — OXYCODONE-ACETAMINOPHEN 5-325 MG PO TABS
2.0000 | ORAL_TABLET | ORAL | Status: DC | PRN
Start: 2016-12-28 — End: 2016-12-29

## 2016-12-28 MED ORDER — TETANUS-DIPHTH-ACELL PERTUSSIS 5-2.5-18.5 LF-MCG/0.5 IM SUSP: 0.5000 mL | Freq: Once | INTRAMUSCULAR | Status: DC

## 2016-12-28 MED ORDER — BENZOCAINE-MENTHOL 20-0.5 % EX AERO: 1.0000 "application " | INHALATION_SPRAY | CUTANEOUS | Status: DC | PRN

## 2016-12-28 MED ORDER — OXYTOCIN 40 UNITS IN LACTATED RINGERS INFUSION - SIMPLE MED: 2.5000 [IU]/h | INTRAVENOUS | Status: DC | PRN

## 2016-12-28 NOTE — Anesthesia Postprocedure Evaluation (Signed)
Anesthesia Post Note  Patient: Anna Callahan  Procedure(s) Performed: AN AD HOC LABOR EPIDURAL     Patient location during evaluation: Mother Baby Anesthesia Type: Epidural Level of consciousness: awake and alert and oriented Pain management: satisfactory to patient Vital Signs Assessment: post-procedure vital signs reviewed and stable Respiratory status: spontaneous breathing and nonlabored ventilation Cardiovascular status: stable Postop Assessment: no headache, no backache, no signs of nausea or vomiting, adequate PO intake and patient able to bend at knees (patient up walking) Anesthetic complications: no    Last Vitals:  Vitals:   12/28/16 0108 12/28/16 0519  BP: (!) 104/58 114/67  Pulse: 86 64  Resp: 18 20  Temp: 36.7 C (!) 36.4 C    Last Pain:  Vitals:   12/28/16 0816  TempSrc:   PainSc: 3    Pain Goal: Patients Stated Pain Goal: 5 (12/27/16 2330)               Madison Hickman

## 2016-12-28 NOTE — Lactation Note (Signed)
This note was copied from a baby's chart. Lactation Consultation Note  Patient Name: Anna Callahan [id number] Date: 12/28/2016 Reason for consult: Follow-up assessment  Baby 23 hours old. Mom reports concern that baby has only had (1) wet diaper, and no wet or stool since his circumcision this afternoon. Assisted mom with hand expression, and mom has easily expressible breasts with colostrum flowing bilaterally. Mom reports that baby not wanting to nurse well on left breast. Mom's left breast has bruise above areola. Discussed positioning with mom and suggested that since baby nursing in cross-cradle on right breast, mom can try football on left breast. Demonstrated positioning with pillows and mom able to latch baby to left breast in football position herself. Baby able to latch deeply and suckle rhythmically with a few swallows noted. Demonstrated to FOB how to flange baby's lower lip with chin tug. Mom reports much more comfort in this position. Mom aware that baby cluster-feeding.   Maternal Data    Feeding Feeding Type: Breast Fed  LATCH Score Latch: Grasps breast easily, tongue down, lips flanged, rhythmical sucking.  Audible Swallowing: A few with stimulation  Type of Nipple: Everted at rest and after stimulation  Comfort (Breast/Nipple): Filling, red/small blisters or bruises, mild/mod discomfort  Hold (Positioning): Assistance needed to correctly position infant at breast and maintain latch.  LATCH Score: 7  Interventions Interventions: Breast feeding basics reviewed;Support pillows;Position options  Lactation Tools Discussed/Used     Consult Status Consult Status: Follow-up Date: 12/29/16 Follow-up type: In-patient    [name] 12/28/2016, 9:59 PM

## 2016-12-28 NOTE — Progress Notes (Signed)
Patient is eating, ambulating, voiding.  Pain control is good.  Vitals:   12/27/16 2330 12/28/16 0010 12/28/16 0108 12/28/16 0519  BP: (!) 106/59 110/63 (!) 104/58 114/67  Pulse: 88 74 86 64  Resp: Temp:  98.2 F (36.8 C) 98 F (36.7 C) (!) 97.5 F (36.4 C)  TempSrc:  Oral Oral Oral  Weight:      Height:        Fundus firm Perineum without swelling.  Lab Results  Component Value Date   WBC 16.1 (H) 12/28/2016   HGB 10.6 (L) 12/28/2016   HCT 32.2 (L) 12/28/2016   MCV 80.3 12/28/2016   PLT 227 12/28/2016    --/--/A POS (10/01 1653)/RI  A/P Post partum day 1.  Routine care.  Expect d/c tomorrow.    Deane Melick A

## 2016-12-28 NOTE — Plan of Care (Signed)
Problem: Life Cycle: Goal: Risk for postpartum hemorrhage will decrease Outcome: Progressing Scant bleeding, fundus firm and midline   

## 2016-12-28 NOTE — Lactation Note (Signed)
This note was copied from a baby's chart. Lactation Consultation Note  Patient Name: Anna Callahan [id number] Date: 12/28/2016 Reason for consult: Initial assessment Baby at 14 hr of life. Mom reports bf did not go well with her 1st child. She reports the baby cried "all the time", her milk "never came in", and "we had to do all this crazy 2 man supplementing, pumping routine". She has already started fenugreek, encouraged her to hold off on that for now. She has bottles from home and learned about paced bottle feeding "just in case I need to supplement, I am not doing that tube at the breast again". She has a silicone manual pump from home that she would rather use than the hospital DEBP for extra stimulation. Gave soap to clean the pump. She has NS but has not used them, encouraged her to hold off because baby is latching well right now. Mom stated baby has a recessed chin but she is able to get a deep latch. A few swallows were noted at this feeding. Mom does have bilateral sore, bruised nipples but declined coconut oil, she likes her nipple cream from home. Discussed baby behavior, feeding frequency, baby belly size, voids, wt loss, breast changes, and nipple care. Demonstrated manual expression, colostrum noted bilaterally. Given lactation handouts. Aware of OP services and support group. Mom will offer the breast on demand and post pump. She will offer supplement as needed by volume guidelines.     Maternal Data Has patient been taught Hand Expression?: Yes Does the patient have breastfeeding experience prior to this delivery?: Yes  Feeding Feeding Type: Breast Fed Length of feed: 15 min  LATCH Score Latch: Grasps breast easily, tongue down, lips flanged, rhythmical sucking.  Audible Swallowing: A few with stimulation  Type of Nipple: Everted at rest and after stimulation  Comfort (Breast/Nipple): Filling, red/small blisters or bruises, mild/mod discomfort  Hold (Positioning):  Assistance needed to correctly position infant at breast and maintain latch.  LATCH Score: 7  Interventions Interventions: Breast feeding basics reviewed;Assisted with latch;Skin to skin;Breast massage;Hand express;Hand pump;Coconut oil;Support pillows;Position options  Lactation Tools Discussed/Used WIC Program: No Pump Review: Milk Storage;Setup, frequency, and cleaning Initiated by:: ES Date initiated:: 12/28/16   Consult Status Consult Status: Follow-up Date: 12/29/16 Follow-up type: In-patient    Rulon Eisenmenger 12/28/2016, 12:57 PM

## 2016-12-28 NOTE — Progress Notes (Signed)
Parents desires circumsision.  All risks, benefits and alternatives discussed with the mother. 

## 2016-12-29 NOTE — Progress Notes (Signed)
Patient is doing well.  She is ambulating, voiding, tolerating PO.  Pain control is good.  Lochia is appropriate  Vitals:   12/28/16 0519 12/28/16 0912 12/28/16 1849 12/29/16 0510  BP: 114/67 116/61 110/61 115/73  Pulse: 64 69 74   Resp: Temp: (!) 97.5 F (36.4 C) 98.4 F (36.9 C) 98.7 F (37.1 C) 98.8 F (37.1 C)  TempSrc: Oral Oral Oral Oral  SpO2:    100%  Weight:      Height:        NAD Fundus firm Ext: No edema  Lab Results  Component Value Date   WBC 16.1 (H) 12/28/2016   HGB 10.6 (L) 12/28/2016   HCT 32.2 (L) 12/28/2016   MCV 80.3 12/28/2016   PLT 227 12/28/2016    --/--/A POS (10/01 1653)/RImmune  A/P 31 y.o. [id number] PPD#2. Routine care.   Meeting all goals--d/c to home today.    [facility] GEFFEL [employer]

## 2016-12-29 NOTE — Lactation Note (Signed)
This note was copied from a baby's chart. Lactation Consultation Note  Patient Name: Anna Callahan [id number] Date: 12/29/2016 Reason for consult: Follow-up assessment   Mother's main concerns are she had supply issues and terrible soreness with first child. She was supplementing with formula with first child and pumping.  Baby 36 hours old and latched on R breast.  L breast has bruise on upper part of areola.  No bruising on R side. Intermittent swallows observed.  Encouraged mother to compress breast during feeding. When baby unlatched on R side, noted nipple was compressed. Oral assessment noted some bottom lip tightness.  Encouraged a deep latch and assisted mother getting baby on deeper. Mother concerned because her first child made her nipples so sore that she brought her own NS to use if she gets very sore. Fitted mother with NS and she states both #20 & #24NS felt fine. She will use NS only is she becomes sore.  Discussed pumping in addition and giving volume back with she starts using NS. Also if she gets very sore, discussed APNO and calling for OP or if she has supply issues. Baby latched on both breasts.  He seems to have a harder time sustaining latch on L side. He came off and on with L side.  Suggest hand expressing before latching on L side which seemed to help him sustain latch. Mom encouraged to feed baby 8-12 times/24 hours and with feeding cues.  Reviewed engorgement care and monitoring voids/stools. Encouraged mother to make OP if she has concerns.      Maternal Data    Feeding Feeding Type: Breast Fed Length of feed: 25 min  LATCH Score Latch: Grasps breast easily, tongue down, lips flanged, rhythmical sucking.  Audible Swallowing: A few with stimulation  Type of Nipple: Everted at rest and after stimulation  Comfort (Breast/Nipple): Filling, red/small blisters or bruises, mild/mod discomfort  Hold (Positioning): No assistance needed to correctly  position infant at breast.  LATCH Score: 8  Interventions Interventions: Breast feeding basics reviewed;Assisted with latch;Skin to skin;Breast massage;Breast compression;Expressed milk  Lactation Tools Discussed/Used     Consult Status Consult Status: Complete    [name] 12/29/2016, 10:16 AM

## 2016-12-29 NOTE — Discharge Summary (Signed)
Obstetric Discharge Summary Reason for Admission: onset of labor Prenatal Procedures: none Intrapartum Procedures: spontaneous vaginal delivery Postpartum Procedures: none Complications-Operative and Postpartum: none Hemoglobin  Date Value Ref Range Status  12/28/2016 10.6 (L) 12.0 - 15.0 g/dL Final   HCT  Date Value Ref Range Status  12/28/2016 32.2 (L) 36.0 - 46.0 % Final    Physical Exam:  General: alert, cooperative and appears stated age 31: appropriate Uterine Fundus: firm DVT Evaluation: No evidence of DVT seen on physical exam.  Discharge Diagnoses: Term Pregnancy-delivered  Discharge Information: Date: 12/29/2016 Activity: pelvic rest Diet: routine Medications: PNV and Ibuprofen Condition: stable Instructions: refer to practice specific booklet Discharge to: home Follow-up Information    Essie Hart, MD Follow up in 4 week(s).   Specialty:  Obstetrics and Gynecology Contact information: [address] [place] [address] [phone]           Newborn Data: Live born female  Birth Weight: 7 lb 5.1 oz (3320 g) APGAR: 8, 9  Newborn Delivery   Birth date/time:  12/27/2016 22:10:00 Delivery type:  Vaginal, Spontaneous Delivery      Home with mother.  Launa Goedken GEFFEL Hayzlee Mcsorley 12/29/2016, 8:22 AM

## 2017-01-11 MED FILL — METOCLOPRAMIDE 10 MG TABLET: 10 | 90 days supply | Qty: 270 | Fill #0

## 2017-01-14 DIAGNOSIS — J180 Bronchopneumonia, unspecified organism: Secondary | ICD-10-CM | POA: Diagnosis not present

## 2017-02-25 MED FILL — LARIN FE 1-20 TABLET: 1-20 | 84 days supply | Qty: 84 | Fill #0

## 2017-04-06 DIAGNOSIS — H527 Unspecified disorder of refraction: Secondary | ICD-10-CM | POA: Diagnosis not present

## 2017-05-03 DIAGNOSIS — Z124 Encounter for screening for malignant neoplasm of cervix: Secondary | ICD-10-CM | POA: Diagnosis not present

## 2017-05-03 DIAGNOSIS — Z01419 Encounter for gynecological examination (general) (routine) without abnormal findings: Secondary | ICD-10-CM | POA: Diagnosis not present

## 2017-05-03 LAB — HM PAP SMEAR: HM PAP: NORMAL

## 2017-05-16 IMAGING — US US OB TRANSVAGINAL
1 series · 14 of 28 positions shown · non-contrast
Comparison: None.

CLINICAL DATA: Vaginal bleeding with quantitative HCG [DATE].
Estimated gestational age per LMP 7 weeks 1 day.

EXAM:
OBSTETRIC <14 WK US AND TRANSVAGINAL OB US
TECHNIQUE: Both transabdominal and transvaginal ultrasound examinations were
performed for complete evaluation of the gestation as well as the
maternal uterus, adnexal regions, and pelvic cul-de-sac.
Transvaginal technique was performed to assess early pregnancy.

[Series 1: us ob transvaginal · 0.20mm/px · 14 of 80 slices shown]
[im 3/80]
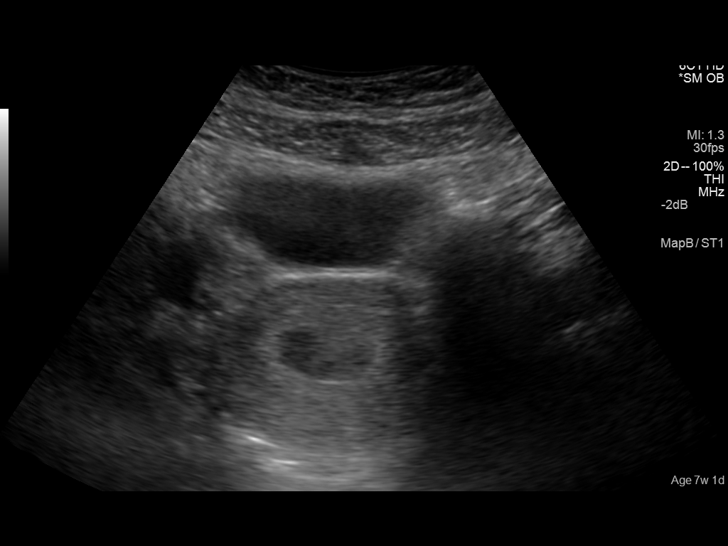
[im 9/80]
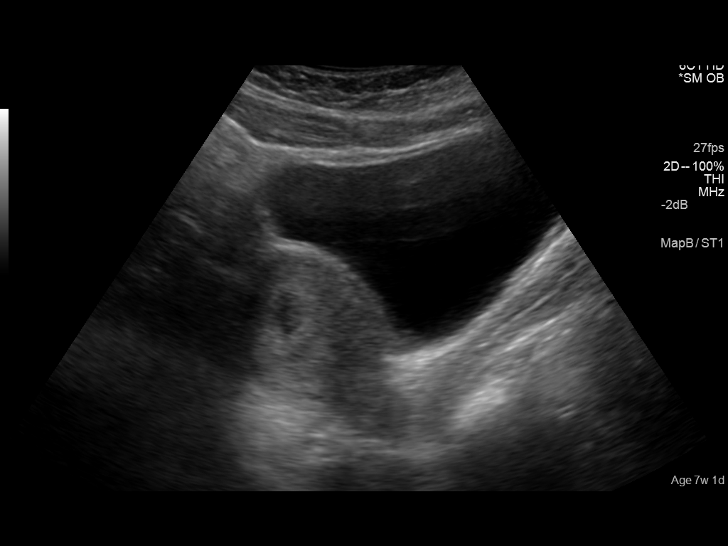
[im 15/80]
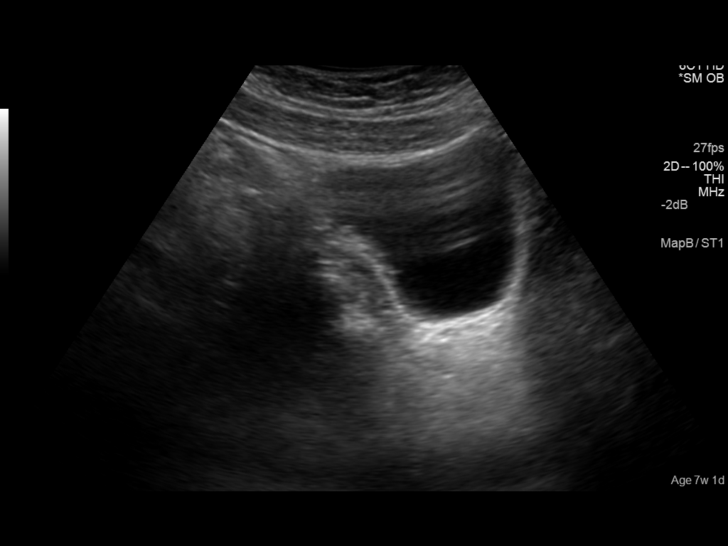
[im 21/80]
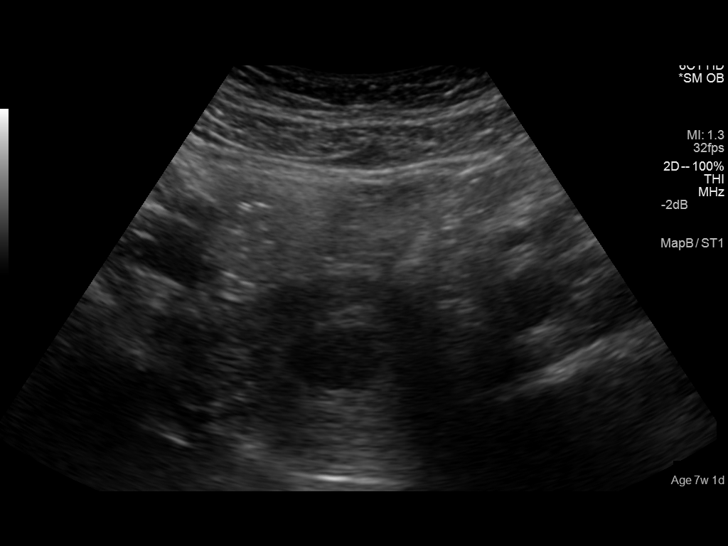
[im 27/80]
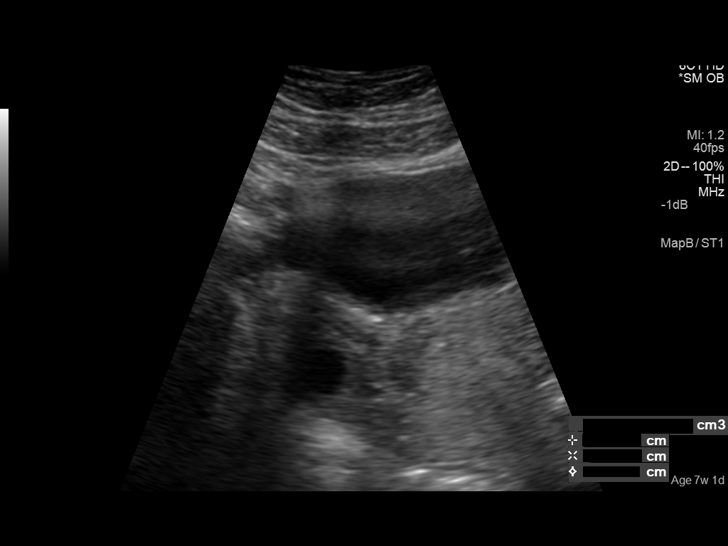
[im 33/80]
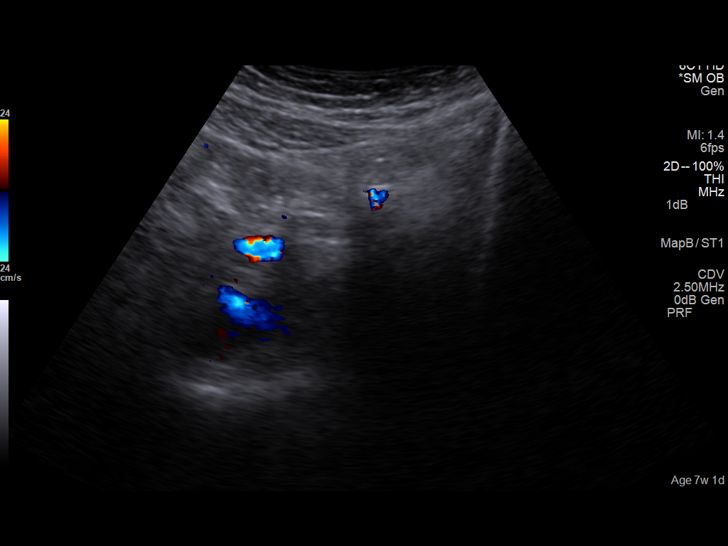
[im 39/80]
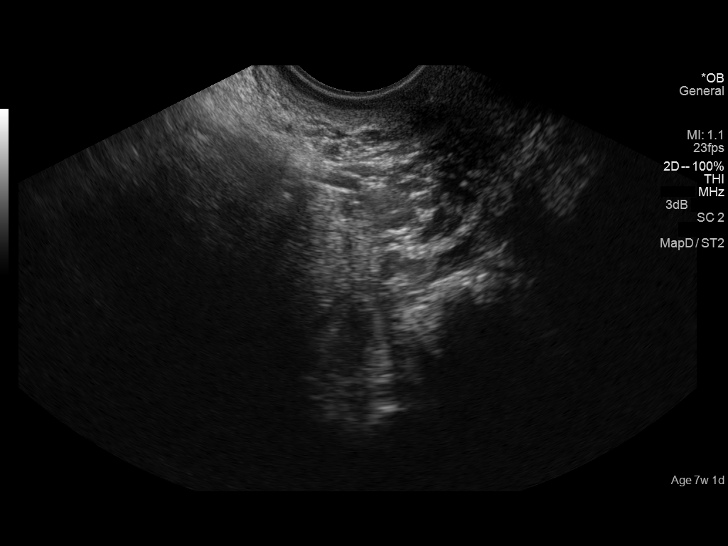
[im 44/80]
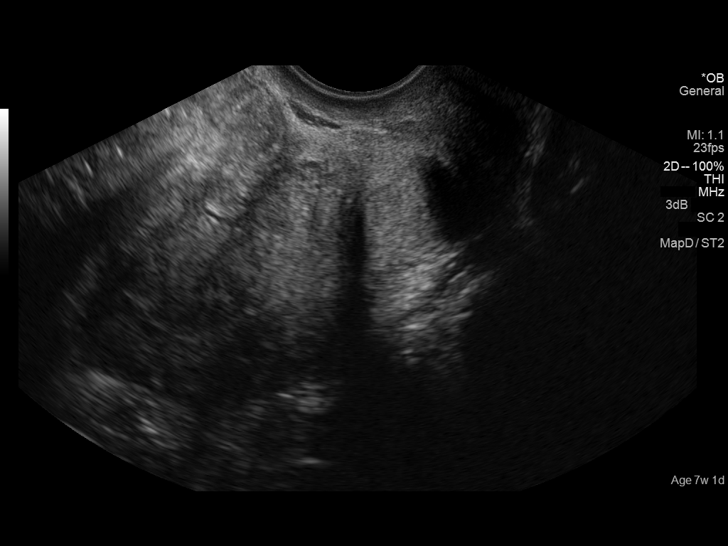
[im 50/80]
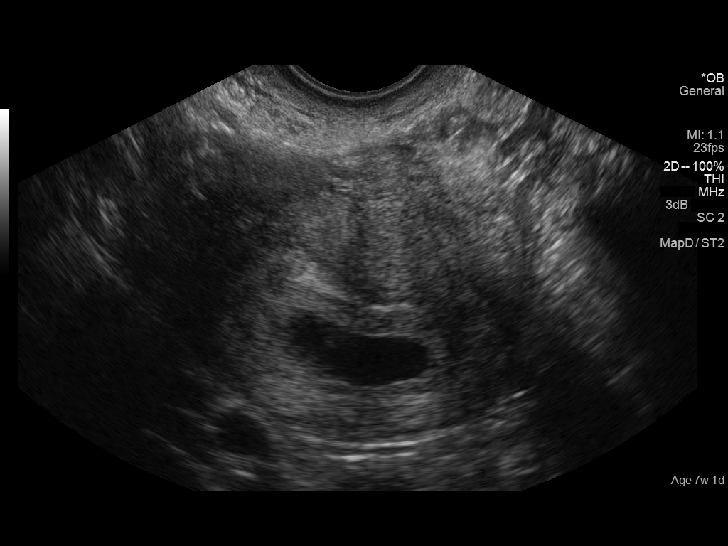
[im 56/80]
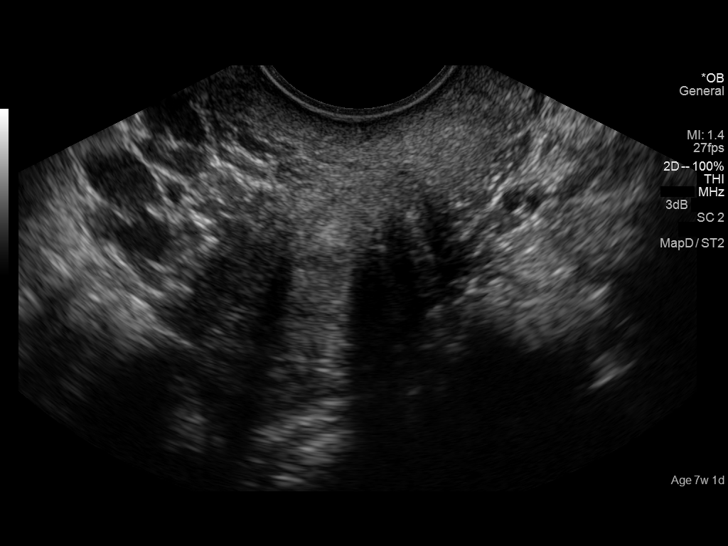
[im 62/80]
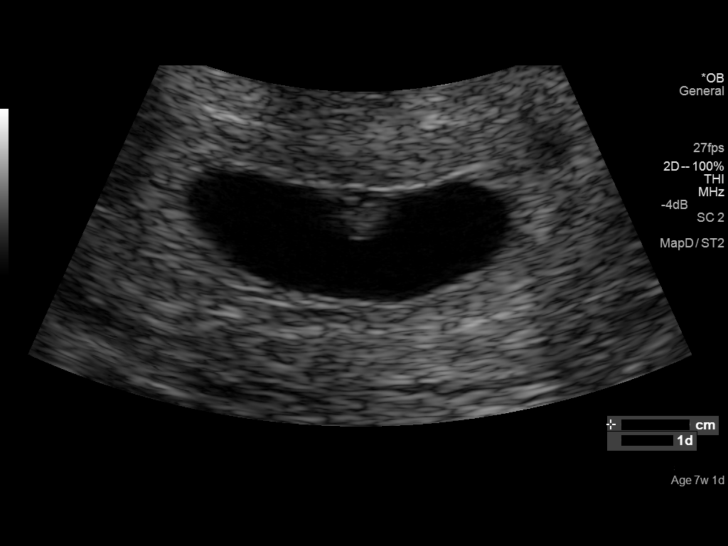
[im 68/80]
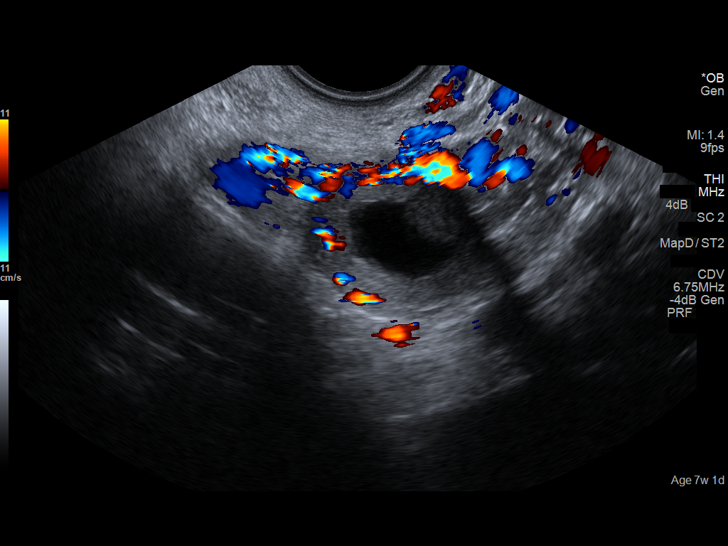
[im 74/80]
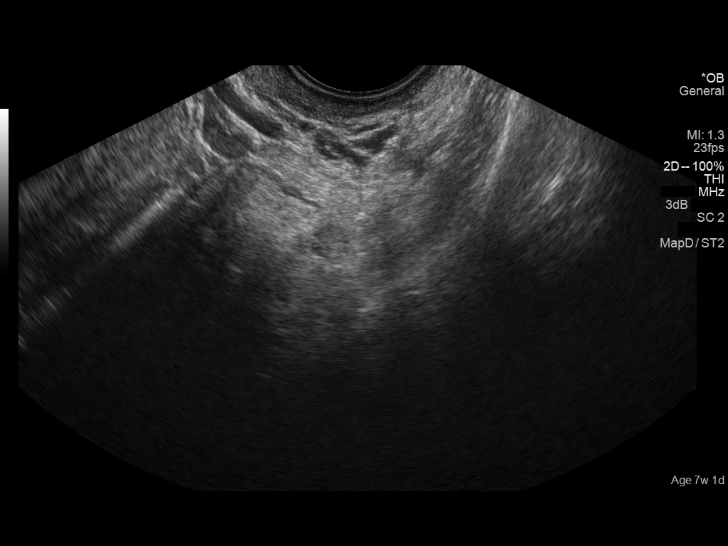
[im 80/80]
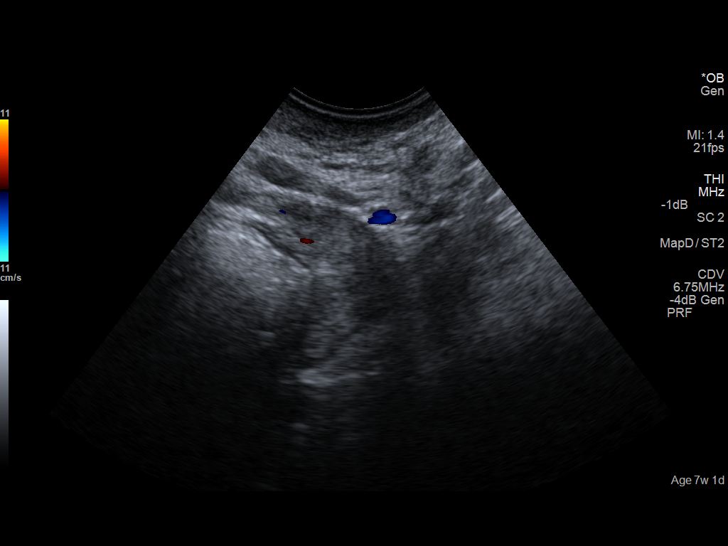

[14 of 28 positions shown; findings below may reference images not displayed]

FINDINGS: Intrauterine gestational sac: Visualized/normal in shape.

Yolk sac:  Visualized.

Embryo:  Visualized.

Cardiac Activity: Visualized.

Heart Rate: 123  bpm

CRL:  3.9  mm   6 w   0 d                  US EDC: 06/06/2015

No evidence of subchorionic hemorrhage.

Maternal uterus/adnexae: Corpus luteal cyst over the right ovary
measuring 2.5 cm. Left ovary within normal. No free fluid.
IMPRESSION: Single live IUP with estimated gestational age 6 weeks 0 days.

## 2017-05-25 ENCOUNTER — Other Ambulatory Visit: Payer: Self-pay

## 2017-05-25 NOTE — Telephone Encounter (Signed)
Received fax for 90 day supply for montelukast. Patient was last seen 10/13/2016. Patient was to return in 6 months. Patient needs office visit.

## 2017-06-25 ENCOUNTER — Other Ambulatory Visit: Payer: Self-pay | Admitting: Allergy & Immunology

## 2017-06-27 NOTE — Telephone Encounter (Signed)
RF for Singulair 10 mg denied at [facility], pt needs an OV

## 2017-07-26 ENCOUNTER — Telehealth: Payer: Self-pay | Admitting: Allergy & Immunology

## 2017-07-26 MED ORDER — MONTELUKAST SODIUM 10 MG PO TABS: 10.0000 mg | ORAL_TABLET | Freq: Every day | ORAL | 0 refills | Status: DC

## 2017-07-26 NOTE — Telephone Encounter (Signed)
Pt called and made appointment for 05.29.2019. And needs to have [facility]air called [facility] [address]. [phone].

## 2017-07-26 NOTE — Telephone Encounter (Signed)
Prescription has been sent in and I left a detailed message advising patient of this.

## 2017-07-27 ENCOUNTER — Other Ambulatory Visit: Payer: Self-pay | Admitting: Allergy & Immunology

## 2017-07-27 MED FILL — FEMYNOR 0.25-35 MG-MCG TABS: 0.25-35 | 84 days supply | Qty: 84 | Fill #0

## 2017-07-27 MED FILL — MONTELUKAST SOD 10 MG TAB: 10 | 30 days supply | Qty: 30 | Fill #0

## 2017-08-24 ENCOUNTER — Encounter: Payer: Self-pay | Admitting: Allergy & Immunology

## 2017-08-24 ENCOUNTER — Ambulatory Visit: Payer: 59 | Admitting: Allergy & Immunology

## 2017-08-24 VITALS — BP 118/70

## 2017-08-24 DIAGNOSIS — J3089 Other allergic rhinitis: Secondary | ICD-10-CM

## 2017-08-24 DIAGNOSIS — J302 Other seasonal allergic rhinitis: Secondary | ICD-10-CM | POA: Diagnosis not present

## 2017-08-24 DIAGNOSIS — J4520 Mild intermittent asthma, uncomplicated: Secondary | ICD-10-CM

## 2017-08-24 MED ORDER — ALBUTEROL SULFATE HFA 108 (90 BASE) MCG/ACT IN AERS: 2.0000 | INHALATION_SPRAY | RESPIRATORY_TRACT | 1 refills | Status: DC | PRN

## 2017-08-24 MED ORDER — MONTELUKAST SODIUM 10 MG PO TABS: 10.0000 mg | ORAL_TABLET | Freq: Every day | ORAL | 11 refills | Status: DC

## 2017-08-24 NOTE — Patient Instructions (Addendum)
1. Mild intermittent asthma, uncomplicated - Lung testing looked great today.  - We will not make any medication changes at this time. - Continue with montelukast  daily.  - Continue with albuterol 2-4 puffs every 4 hours as needed.   2. Seasonal and perennial allergic rhinitis - Continue with montelukast  daily at night.  - Continue with loratadine  daily in the morning.  3. Return in about 1 year (around 08/25/2018).   Please inform us of any Emergency Department visits, hospitalizations, or changes in symptoms. Call us before going to the ED for breathing or allergy symptoms since we might be able to fit you in for a sick visit. Feel free to contact us anytime with any questions, problems, or concerns.  It was a pleasure to see you and your family again today! Your little man is adorable!   Websites that have reliable patient information: 1. American Academy of Asthma, Allergy, and Immunology: www.aaaai.org 2. Food Allergy Research and Education (FARE): foodallergy.org 3. Mothers of Asthmatics: http://www.asthmacommunitynetwork.org 4. American College of Allergy, Asthma, and Immunology: MissingWeapons.ca   Make sure you are registered to vote!

## 2017-08-24 NOTE — Progress Notes (Signed)
FOLLOW UP  Date of Service/Encounter:  08/24/17   Assessment:   Mild intermittent asthma, uncomplicated  Seasonal and perennial allergic rhinitis  Plan/Recommendations:   1. Mild intermittent asthma, uncomplicated - Lung testing looked great today.  - We will not make any medication changes at this time. - Continue with montelukast  daily.  - Continue with albuterol 2-4 puffs every 4 hours as needed.   2. Seasonal and perennial allergic rhinitis - Continue with montelukast  daily at night.  - Continue with loratadine  daily in the morning.  3. Return in about 1 year (around 08/25/2018).  Subjective:   Anna Callahan is a 32 y.o. female presenting today for follow up of  Chief Complaint  Patient presents with  . Asthma    Anna Callahan has a history of the following: Patient Active Problem List   Diagnosis Date Noted  . Normal labor 12/27/2016  . Mild intermittent asthma with acute exacerbation 10/13/2016  . Other seasonal allergic rhinitis 10/13/2016  . Indication for care in labor or delivery 06/08/2015    History obtained from: chart review and patient.  [facility]'s Primary Care Provider is Patient, No Pcp Per.     Anna Callahan is a 32 y.o. female presenting for a follow up visit. She was last seen in July 2018. At that time, her allergic rhinitis was controlled with Singulair  daily as well as Allegra  as needed. Her intermittent asthma was not well controlled and she was started on Pulmicort two puffs BID for a couple of weeks to help her recover from her illness.  We were very careful with prescribing any steroids since she was pregnant at that time.   Since the last visit, she has done well.  She did deliver a healthy newborn female in October 2018. She has been struggling with the allergies outside. She is on Claritin the morning and Singulair at night. She does have her rescue inhaler to use as needed. She has 7- more puffs on her  current inhaler.  Anna Callahan's asthma has been well controlled. She has not required rescue medication, experienced nocturnal awakenings due to lower respiratory symptoms, nor have activities of daily living been limited. She has required no Emergency Department or Urgent Care visits for her asthma. She has required zero courses of systemic steroids for asthma exacerbations since the last visit. ACT score today is 24, indicating excellent asthma symptom control.   Otherwise, there have been no changes to her past medical history, surgical history, family history, or social history. 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  She hasone daughter and now one son at home.    Review of Systems: a 14-point review of systems is pertinent for what is mentioned in HPI.  Otherwise, all other systems were negative. Constitutional: negative other than that listed in the HPI Eyes: negative other than that listed in the HPI Ears, nose, mouth, throat, and face: negative other than that listed in the HPI Respiratory: negative other than that listed in the HPI Cardiovascular: negative other than that listed in the HPI Gastrointestinal: negative other than that listed in the HPI Genitourinary: negative other than that listed in the HPI Integument: negative other than that listed in the HPI Hematologic: negative other than that listed in the HPI Musculoskeletal: negative other than that listed in the HPI Neurological: negative other than that listed in the HPI Allergy/Immunologic: negative other than that listed in the HPI  Objective:   Blood pressure 118/70, unknown if currently breastfeeding. There is no height or weight on file to calculate BMI.   Physical Exam:  General: Alert, interactive, in no acute distress. Pleasant female.  Eyes: No conjunctival injection bilaterally, no discharge on the right, no discharge on the left and no Horner-Trantas dots present. PERRL  bilaterally. EOMI without pain. No photophobia.  Ears: Right TM pearly gray with normal light reflex, Left TM pearly gray with normal light reflex, Right TM intact without perforation and Left TM intact without perforation.  Nose/Throat: External nose within normal limits and septum midline. Turbinates edematous with clear discharge. Posterior oropharynx mildly erythematous without cobblestoning in the posterior oropharynx. Tonsils 2+ without exudates.  Tongue without thrush. Lungs: Clear to auscultation without wheezing, rhonchi or rales. No increased work of breathing. CV: Normal S1/S2. No murmurs. Capillary refill <2 seconds.  Skin: Warm and dry, without lesions or rashes. Neuro:   Grossly intact. No focal deficits appreciated. Responsive to questions.  Diagnostic studies:   Spirometry: results normal (FEV1: 2.83/96%, FVC: 3.58/102%, FEV1/FVC: 79%).    Spirometry consistent with normal pattern.   Allergy Studies: none    Anna Bonds, MD  [facility]

## 2017-09-06 ENCOUNTER — Encounter: Payer: Self-pay | Admitting: Family Medicine

## 2017-09-06 ENCOUNTER — Ambulatory Visit (INDEPENDENT_AMBULATORY_CARE_PROVIDER_SITE_OTHER): Payer: 59 | Admitting: Family Medicine

## 2017-09-06 ENCOUNTER — Encounter: Payer: Self-pay | Admitting: *Deleted

## 2017-09-06 VITALS — BP 104/71 | HR 65 | Temp 97.8°F | Resp 20 | Ht 62.0 in | Wt 154.0 lb

## 2017-09-06 DIAGNOSIS — E663 Overweight: Secondary | ICD-10-CM | POA: Diagnosis not present

## 2017-09-06 DIAGNOSIS — Z793 Long term (current) use of hormonal contraceptives: Secondary | ICD-10-CM

## 2017-09-06 DIAGNOSIS — Z0000 Encounter for general adult medical examination without abnormal findings: Secondary | ICD-10-CM | POA: Diagnosis not present

## 2017-09-06 DIAGNOSIS — Z79899 Other long term (current) drug therapy: Secondary | ICD-10-CM | POA: Diagnosis not present

## 2017-09-06 DIAGNOSIS — Z131 Encounter for screening for diabetes mellitus: Secondary | ICD-10-CM

## 2017-09-06 LAB — CBC WITH DIFFERENTIAL/PLATELET
BASOS PCT: 2.4 % (ref 0.0–3.0)
Basophils Absolute: 0.1 10*3/uL (ref 0.0–0.1)
EOS ABS: 0.3 10*3/uL (ref 0.0–0.7)
Eosinophils Relative: 4.4 % (ref 0.0–5.0)
HCT: 41 % (ref 36.0–46.0)
Hemoglobin: 13.7 g/dL (ref 12.0–15.0)
Lymphocytes Relative: 30.2 % (ref 12.0–46.0)
Lymphs Abs: 1.8 10*3/uL (ref 0.7–4.0)
MCHC: 33.5 g/dL (ref 30.0–36.0)
MCV: 85.4 fl (ref 78.0–100.0)
MONO ABS: 0.6 10*3/uL (ref 0.1–1.0)
Monocytes Relative: 9.7 % (ref 3.0–12.0)
NEUTROS ABS: 3.1 10*3/uL (ref 1.4–7.7)
Neutrophils Relative %: 53.3 % (ref 43.0–77.0)
PLATELETS: 257 10*3/uL (ref 150.0–400.0)
RBC: 4.8 Mil/uL (ref 3.87–5.11)
RDW: 14.5 % (ref 11.5–15.5)
WBC: 5.8 10*3/uL (ref 4.0–10.5)

## 2017-09-06 LAB — LIPID PANEL
Cholesterol: 200 mg/dL (ref 0–200)
HDL: 53.6 mg/dL (ref >39.00)
LDL CALC: 118 mg/dL — AB (ref 0–99)
NONHDL: 146.85
Total CHOL/HDL Ratio: 4
Triglycerides: 142 mg/dL (ref 0.0–149.0)
VLDL: 28.4 mg/dL (ref 0.0–40.0)

## 2017-09-06 LAB — COMPREHENSIVE METABOLIC PANEL
ALT: 16 U/L (ref 0–35)
AST: 16 U/L (ref 0–37)
Albumin: 4.3 g/dL (ref 3.5–5.2)
Alkaline Phosphatase: 45 U/L (ref 39–117)
BUN: 14 mg/dL (ref 6–23)
CHLORIDE: 104 meq/L (ref 96–112)
CO2: 27 meq/L (ref 19–32)
CREATININE: 0.8 mg/dL (ref 0.40–1.20)
Calcium: 9.5 mg/dL (ref 8.4–10.5)
GFR: 87.59 mL/min (ref >60.00)
Glucose, Bld: 85 mg/dL (ref 70–99)
Potassium: 4.8 mEq/L (ref 3.5–5.1)
SODIUM: 138 meq/L (ref 135–145)
Total Bilirubin: 0.4 mg/dL (ref 0.2–1.2)
Total Protein: 7.1 g/dL (ref 6.0–8.3)

## 2017-09-06 LAB — HEMOGLOBIN A1C: Hgb A1c MFr Bld: 5.4 % (ref 4.6–6.5)

## 2017-09-06 MED FILL — MONTELUKAST SOD 10 MG TAB: 10 | 30 days supply | Qty: 30 | Fill #0

## 2017-09-06 NOTE — Patient Instructions (Addendum)
Health Maintenance, Female Adopting a healthy lifestyle and getting preventive care can go a long way to promote health and wellness. Talk with your health care provider about what schedule of regular examinations is right for you. This is a good chance for you to check in with your provider about disease prevention and staying healthy. In between checkups, there are plenty of things you can do on your own. Experts have done a lot of research about which lifestyle changes and preventive measures are most likely to keep you healthy. Ask your health care provider for more information. Weight and diet Eat a healthy diet  Be sure to include plenty of vegetables, fruits, low-fat dairy products, and lean protein.  Do not eat a lot of foods high in solid fats, added sugars, or salt.  Get regular exercise. This is one of the most important things you can do for your health. ? Most adults should exercise for at least 150 minutes each week. The exercise should increase your heart rate and make you sweat (moderate-intensity exercise). ? Most adults should also do strengthening exercises at least twice a week. This is in addition to the moderate-intensity exercise.  Maintain a healthy weight  Body mass index (BMI) is a measurement that can be used to identify possible weight problems. It estimates body fat based on height and weight. Your health care provider can help determine your BMI and help you achieve or maintain a healthy weight.  For females 20 years of age and older: ? A BMI below 18.5 is considered underweight. ? A BMI of 18.5 to 24.9 is normal. ? A BMI of 25 to 29.9 is considered overweight. ? A BMI of 30 and above is considered obese.  Watch levels of cholesterol and blood lipids  You should start having your blood tested for lipids and cholesterol at 33 years of age, then have this test every 5 years.  You may need to have your cholesterol levels checked more often if: ? Your lipid or  cholesterol levels are high. ? You are older than 33 years of age. ? You are at high risk for heart disease.  Cancer screening Lung Cancer  Lung cancer screening is recommended for adults 55-80 years old who are at high risk for lung cancer because of a history of smoking.  A yearly low-dose CT scan of the lungs is recommended for people who: ? Currently smoke. ? Have quit within the past 15 years. ? Have at least a 30-pack-year history of smoking. A pack year is smoking an average of one pack of cigarettes a day for 1 year.  Yearly screening should continue until it has been 15 years since you quit.  Yearly screening should stop if you develop a health problem that would prevent you from having lung cancer treatment.  Breast Cancer  Practice breast self-awareness. This means understanding how your breasts normally appear and feel.  It also means doing regular breast self-exams. Let your health care provider know about any changes, no matter how small.  If you are in your 20s or 30s, you should have a clinical breast exam (CBE) by a health care provider every 1-3 years as part of a regular health exam.  If you are 40 or older, have a CBE every year. Also consider having a breast X-ray (mammogram) every year.  If you have a family history of breast cancer, talk to your health care provider about genetic screening.  If you are at high risk   for breast cancer, talk to your health care provider about having an MRI and a mammogram every year.  Breast cancer gene (BRCA) assessment is recommended for women who have family members with BRCA-related cancers. BRCA-related cancers include: ? Breast. ? Ovarian. ? Tubal. ? Peritoneal cancers.  Results of the assessment will determine the need for genetic counseling and BRCA1 and BRCA2 testing.  Cervical Cancer Your health care provider may recommend that you be screened regularly for cancer of the pelvic organs (ovaries, uterus, and  vagina). This screening involves a pelvic examination, including checking for microscopic changes to the surface of your cervix (Pap test). You may be encouraged to have this screening done every 3 years, beginning at age 22.  For women ages 56-65, health care providers may recommend pelvic exams and Pap testing every 3 years, or they may recommend the Pap and pelvic exam, combined with testing for human papilloma virus (HPV), every 5 years. Some types of HPV increase your risk of cervical cancer. Testing for HPV may also be done on women of any age with unclear Pap test results.  Other health care providers may not recommend any screening for nonpregnant women who are considered low risk for pelvic cancer and who do not have symptoms. Ask your health care provider if a screening pelvic exam is right for you.  If you have had past treatment for cervical cancer or a condition that could lead to cancer, you need Pap tests and screening for cancer for at least 20 years after your treatment. If Pap tests have been discontinued, your risk factors (such as having a new sexual partner) need to be reassessed to determine if screening should resume. Some women have medical problems that increase the chance of getting cervical cancer. In these cases, your health care provider may recommend more frequent screening and Pap tests.  Colorectal Cancer  This type of cancer can be detected and often prevented.  Routine colorectal cancer screening usually begins at 33 years of age and continues through 33 years of age.  Your health care provider may recommend screening at an earlier age if you have risk factors for colon cancer.  Your health care provider may also recommend using home test kits to check for hidden blood in the stool.  A small camera at the end of a tube can be used to examine your colon directly (sigmoidoscopy or colonoscopy). This is done to check for the earliest forms of colorectal  cancer.  Routine screening usually begins at age 33.  Direct examination of the colon should be repeated every 5-10 years through 33 years of age. However, you may need to be screened more often if early forms of precancerous polyps or small growths are found.  Skin Cancer  Check your skin from head to toe regularly.  Tell your health care provider about any new moles or changes in moles, especially if there is a change in a mole's shape or color.  Also tell your health care provider if you have a mole that is larger than the size of a pencil eraser.  Always use sunscreen. Apply sunscreen liberally and repeatedly throughout the day.  Protect yourself by wearing long sleeves, pants, a wide-brimmed hat, and sunglasses whenever you are outside.  Heart disease, diabetes, and high blood pressure  High blood pressure causes heart disease and increases the risk of stroke. High blood pressure is more likely to develop in: ? People who have blood pressure in the high end of  the normal range (130-139/85-89 mm Hg). ? People who are overweight or obese. ? People who are African American.  If you are 21-29 years of age, have your blood pressure checked every 3-5 years. If you are 3 years of age or older, have your blood pressure checked every year. You should have your blood pressure measured twice-once when you are at a hospital or clinic, and once when you are not at a hospital or clinic. Record the average of the two measurements. To check your blood pressure when you are not at a hospital or clinic, you can use: ? An automated blood pressure machine at a pharmacy. ? A home blood pressure monitor.  If you are between 17 years and 37 years old, ask your health care provider if you should take aspirin to prevent strokes.  Have regular diabetes screenings. This involves taking a blood sample to check your fasting blood sugar level. ? If you are at a normal weight and have a low risk for diabetes,  have this test once every three years after 33 years of age. ? If you are overweight and have a high risk for diabetes, consider being tested at a younger age or more often. Preventing infection Hepatitis B  If you have a higher risk for hepatitis B, you should be screened for this virus. You are considered at high risk for hepatitis B if: ? You were born in a country where hepatitis B is common. Ask your health care provider which countries are considered high risk. ? Your parents were born in a high-risk country, and you have not been immunized against hepatitis B (hepatitis B vaccine). ? You have HIV or AIDS. ? You use needles to inject street drugs. ? You live with someone who has hepatitis B. ? You have had sex with someone who has hepatitis B. ? You get hemodialysis treatment. ? You take certain medicines for conditions, including cancer, organ transplantation, and autoimmune conditions.  Hepatitis C  Blood testing is recommended for: ? Everyone born from [birth year] through 1966. ? Anyone with known risk factors for hepatitis C.  Sexually transmitted infections (STIs)  You should be screened for sexually transmitted infections (STIs) including gonorrhea and chlamydia if: ? You are sexually active and are younger than 33 years of age. ? You are older than 33 years of age and your health care provider tells you that you are at risk for this type of infection. ? Your sexual activity has changed since you were last screened and you are at an increased risk for chlamydia or gonorrhea. Ask your health care provider if you are at risk.  If you do not have HIV, but are at risk, it may be recommended that you take a prescription medicine daily to prevent HIV infection. This is called pre-exposure prophylaxis (PrEP). You are considered at risk if: ? You are sexually active and do not regularly use condoms or know the HIV status of your partner(s). ? You take drugs by injection. ? You are  sexually active with a partner who has HIV.  Talk with your health care provider about whether you are at high risk of being infected with HIV. If you choose to begin PrEP, you should first be tested for HIV. You should then be tested every 3 months for as long as you are taking PrEP. Pregnancy  If you are premenopausal and you may become pregnant, ask your health care provider about preconception counseling.  If you may become  pregnant, take 400 to 800 micrograms (mcg) of folic acid every day.  If you want to prevent pregnancy, talk to your health care provider about birth control (contraception). Osteoporosis and menopause  Osteoporosis is a disease in which the bones lose minerals and strength with aging. This can result in serious bone fractures. Your risk for osteoporosis can be identified using a bone density scan.  If you are 65 years of age or older, or if you are at risk for osteoporosis and fractures, ask your health care provider if you should be screened.  Ask your health care provider whether you should take a calcium or vitamin D supplement to lower your risk for osteoporosis.  Menopause may have certain physical symptoms and risks.  Hormone replacement therapy may reduce some of these symptoms and risks. Talk to your health care provider about whether hormone replacement therapy is right for you. Follow these instructions at home:  Schedule regular health, dental, and eye exams.  Stay current with your immunizations.  Do not use any tobacco products including cigarettes, chewing tobacco, or electronic cigarettes.  If you are pregnant, do not drink alcohol.  If you are breastfeeding, limit how much and how often you drink alcohol.  Limit alcohol intake to no more than 1 drink per day for nonpregnant women. One drink equals 12 ounces of beer, 5 ounces of wine, or 1 ounces of hard liquor.  Do not use street drugs.  Do not share needles.  Ask your health care  provider for help if you need support or information about quitting drugs.  Tell your health care provider if you often feel depressed.  Tell your health care provider if you have ever been abused or do not feel safe at home. This information is not intended to replace advice given to you by your health care provider. Make sure you discuss any questions you have with your health care provider. Document Released: 09/28/2010 Document Revised: 08/21/2015 Document Reviewed: 12/17/2014 Elsevier Interactive Patient Education  2018 [employer]  Please help us help you:  We are honored you have chosen [facility] for your Primary Care home. Below you will find basic instructions that you may need to access in the future. Please help us help you by reading the instructions, which cover many of the frequent questions we experience.   Prescription refills and request:  -In order to allow more efficient response time, please call your pharmacy for all refills. They will forward the request electronically to us. This allows for the quickest possible response. Request left on a nurse line can take longer to refill, since these are checked as time allows between office patients and other phone calls.  - refill request can take up to 3-5 working days to complete.  - If request is sent electronically and request is appropiate, it is usually completed in 1-2 business days.  - all patients will need to be seen routinely for all chronic medical conditions requiring prescription medications (see follow-up below). If you are overdue for follow up on your condition, you will be asked to make an appointment and we will call in enough medication to cover you until your appointment (up to 30 days).  - all controlled substances will require a face to face visit to request/refill.  - if you desire your prescriptions to go through a new pharmacy, and have an active script at original pharmacy, you will need to call  your pharmacy and have scripts transferred to new   pharmacy. This is completed between the pharmacy locations and not by your provider.    Results: If any images or labs were ordered, it can take up to 1 week to get results depending on the test ordered and the lab/facility running and resulting the test. - Normal or stable results, which do not need further discussion, may be released to your mychart immediately with attached note to you. A call may not be generated for normal results. Please make certain to sign up for mychart. If you have questions on how to activate your mychart you can call the front office.  - If your results need further discussion, our office will attempt to contact you via phone, and if unable to reach you after 2 attempts, we will release your abnormal result to your mychart with instructions.  - All results will be automatically released in mychart after 1 week.  - Your provider will provide you with explanation and instruction on all relevant material in your results. Please keep in mind, results and labs may appear confusing or abnormal to the untrained eye, but it does not mean they are actually abnormal for you personally. If you have any questions about your results that are not covered, or you desire more detailed explanation than what was provided, you should make an appointment with your provider to do so.   Our office handles many outgoing and incoming calls daily. If we have not contacted you within 1 week about your results, please check your mychart to see if there is a message first and if not, then contact our office.  In helping with this matter, you help decrease call volume, and therefore allow us to be able to respond to patients needs more efficiently.   Acute office visits (sick visit):  An acute visit is intended for a new problem and are scheduled in shorter time slots to allow schedule openings for patients with new problems. This is the appropriate visit  to discuss a new problem. Problems will not be addressed by phone call or Echart message. Appointment is needed if requesting treatment. In order to provide you with excellent quality medical care with proper time for you to explain your problem, have an exam and receive treatment with instructions, these appointments should be limited to one new problem per visit. If you experience a new problem, in which you desire to be addressed, please make an acute office visit, we save openings on the schedule to accommodate you. Please do not save your new problem for any other type of visit, let us take care of it properly and quickly for you.   Follow up visits:  Depending on your condition(s) your provider will need to see you routinely in order to provide you with quality care and prescribe medication(s). Most chronic conditions (Example: hypertension, Diabetes, depression/anxiety... etc), require visits a couple times a year. Your provider will instruct you on proper follow up for your personal medical conditions and history. Please make certain to make follow up appointments for your condition as instructed. Failing to do so could result in lapse in your medication treatment/refills. If you request a refill, and are overdue to be seen on a condition, we will always provide you with a 30 day script (once) to allow you time to schedule.    Medicare wellness (well visit): - we have a wonderful Nurse (Kim), that will meet with you and provide you will yearly medicare wellness visits. These visits should occur yearly (can not   be scheduled less than 1 calendar year apart) and cover preventive health, immunizations, advance directives and screenings you are entitled to yearly through your medicare benefits. Do not miss out on your entitled benefits, this is when medicare will pay for these benefits to be ordered for you.  These are strongly encouraged by your provider and is the appropriate type of visit to make  certain you are up to date with all preventive health benefits. If you have not had your medicare wellness exam in the last 12 months, please make certain to schedule one by calling the office and schedule your medicare wellness with Kim as soon as possible.   Yearly physical (well visit):  - Adults are recommended to be seen yearly for physicals. Check with your insurance and date of your last physical, most insurances require one calendar year between physicals. Physicals include all preventive health topics, screenings, medical exam and labs that are appropriate for gender/age and history. You may have fasting labs needed at this visit. This is a well visit (not a sick visit), new problems should not be covered during this visit (see acute visit).  - Pediatric patients are seen more frequently when they are younger. Your provider will advise you on well child visit timing that is appropriate for your their age. - This is not a medicare wellness visit. Medicare wellness exams do not have an exam portion to the visit. Some medicare companies allow for a physical, some do not allow a yearly physical. If your medicare allows a yearly physical you can schedule the medicare wellness with our nurse Kim and have your physical with your provider after, on the same day. Please check with insurance for your full benefits.   Late Policy/No Shows:  - all new patients should arrive 15-30 minutes earlier than appointment to allow us time  to  obtain all personal demographics,  insurance information and for you to complete office paperwork. - All established patients should arrive 10-15 minutes earlier than appointment time to update all information and be checked in .  - In our best efforts to run on time, if you are late for your appointment you will be asked to either reschedule or if able, we will work you back into the schedule. There will be a wait time to work you back in the schedule,  depending on  availability.  - If you are unable to make it to your appointment as scheduled, please call 24 hours ahead of time to allow us to fill the time slot with someone else who needs to be seen. If you do not cancel your appointment ahead of time, you may be charged a no show fee.   

## 2017-09-06 NOTE — Progress Notes (Signed)
Patient ID: Anna Callahan, female  DOB: Jul 07, 1985, 32 y.o.   MRN: [id number] Patient Care Team    Relationship Specialty Notifications Start End  Ma Hillock, DO PCP - General Family Medicine  09/06/17   Bobbye Charleston, MD Consulting Physician Obstetrics and Gynecology  09/06/17   Valentina Shaggy, MD Consulting Physician Allergy and Immunology  09/06/17     Chief Complaint  Patient presents with  . Establish Care  . Annual Exam    Subjective:  Anna Callahan is a 32 y.o.  female present for new patient establishment/CPE. All past medical history, surgical history, allergies, family history, immunizations, medications and social history were updated in the electronic medical record today. All recent labs, ED visits and hospitalizations within the last year were reviewed.  Health maintenance:  Colonoscopy: No fhx, screen at 49 Mammogram: no fhx, screen at 39, has GYN Cervical cancer screening: last pap: 02/2017, Dr. Philis Pique (GYN) results requested Immunizations: tdap UTD 2015, Influenza UTD 2018  Infectious disease screening: HIV completed DEXA: N/A Assistive device: none Oxygen MQK:MMNO Patient has a Dental home. Hospitalizations/ED visits:reviewed  Depression screen [facility] 2/9 09/06/2017  Decreased Interest 0  Down, Depressed, Hopeless 0  PHQ - 2 Score 0   No flowsheet data found.    Current Exercise Habits: Home exercise routine, Type of exercise: walking, Time (Minutes): 30, Frequency (Times/Week): 3, Weekly Exercise (Minutes/Week): 90, Intensity: Mild Exercise limited by: None identified Fall Risk  09/06/2017  Falls in the past year? No     Immunization History  Administered Date(s) Administered  . Influenza,inj,Quad PF,6+ Mos 12/29/2016  . Tdap 11/07/2007    No exam data present  Past Medical History:  Diagnosis Date  . Asthma    No Known Allergies Past Surgical History:  Procedure Laterality Date  . WISDOM TOOTH EXTRACTION Bilateral     Family History  Problem Relation Age of Onset  . Hyperlipidemia Mother   . Hyperlipidemia Father   . Hyperlipidemia Maternal Grandmother   . Arthritis Maternal Grandmother   . Hyperlipidemia Maternal Grandfather   . Hearing loss Maternal Grandfather   . Hypertension Maternal Grandfather   . Stroke Maternal Grandfather   . Arthritis Paternal Grandmother   . Hearing loss Paternal Grandmother   . Diabetes Paternal Grandfather    Social History   Socioeconomic History  . Marital status: Married    Spouse name: Not on file  . Number of children: Not on file  . Years of education: Not on file  . Highest education level: Not on file  Occupational History  . Not on file  Social Needs  . Financial resource strain: Not on file  . Food insecurity:    Worry: Not on file    Inability: Not on file  . Transportation needs:    Medical: Not on file    Non-medical: Not on file  Tobacco Use  . Smoking status: Never Smoker  . Smokeless tobacco: Never Used  Substance and Sexual Activity  . Alcohol use: Yes    Comment: occasional  . Drug use: No  . Sexual activity: Yes    Partners: Male  Lifestyle  . Physical activity:    Days per week: Not on file    Minutes per session: Not on file  . Stress: Not on file  Relationships  . Social connections:    Talks on phone: Not on file    Gets together: Not on file    Attends religious service: Not on  file    Active member of club or organization: Not on file    Attends meetings of clubs or organizations: Not on file    Relationship status: Not on file  . Intimate partner violence:    Fear of current or ex partner: Not on file    Emotionally abused: Not on file    Physically abused: Not on file    Forced sexual activity: Not on file  Other Topics Concern  . Not on file  Social History Narrative  . Not on file   Allergies as of 09/06/2017   No Known Allergies     Medication List        Accurate as of 09/06/17  9:48 AM. Always  use your most recent med list.          albuterol 108 (90 Base) MCG/ACT inhaler Commonly known as:  PROAIR HFA Inhale 2 puffs into the lungs every 4 (four) hours as needed for wheezing or shortness of breath.   CLARITIN 10 MG Caps Generic drug:  Loratadine Take by mouth.   FEMYNOR 0.25-35 MG-MCG tablet Generic drug:  norgestimate-ethinyl estradiol   montelukast 10 MG tablet Commonly known as:  SINGULAIR Take 1 tablet (10 mg total) by mouth at bedtime.       All past medical history, surgical history, allergies, family history, immunizations andmedications were updated in the EMR today and reviewed under the history and medication portions of their EMR.    No results found for this or any previous visit (from the past 2160 hour(s)).  No results found.   ROS: 14 pt review of systems performed and negative (unless mentioned in an HPI)  Objective: BP 104/71 (BP Location: Left Arm, Patient Position: Sitting, Cuff Size: Normal)   Pulse 65   Temp 97.8 F (36.6 C)   Resp 20   Ht '5\' 2"'  (1.575 m)   Wt 154 lb (69.9 kg)   LMP 08/15/2017   SpO2 99%   BMI 28.17 kg/m  Gen: Afebrile. No acute distress. Nontoxic in appearance, well-developed, well-nourished,  Overweight, pleasant caucasian female.  HENT: AT. [place]. Bilateral TM visualized and normal in appearance, normal external auditory canal. MMM, no oral lesions, adequate dentition. Bilateral nares within normal limits. Throat without erythema, ulcerations or exudates. no Cough on exam, no hoarseness on exam. Eyes:Pupils Equal Round Reactive to light, Extraocular movements intact,  Conjunctiva without redness, discharge or icterus. Neck/lymp/endocrine: Supple,no lymphadenopathy, no thyromegaly CV: RRR no murmur, no edema, +2/4 P posterior tibialis pulses.  Chest: CTAB, no wheeze, rhonchi or crackles. normal Respiratory effort. good Air movement. Abd: Soft. flat. NTND. BS present. no Masses palpated. No hepatosplenomegaly. No rebound  tenderness or guarding. Skin: no rashes, purpura or petechiae. Warm and well-perfused. Skin intact. Neuro/Msk:  Normal gait. PERLA. EOMi. Alert. Oriented x3.  Cranial nerves II through XII intact. Muscle strength 5/5 upper/lower extremity. DTRs equal bilaterally. Psych: Normal affect, dress and demeanor. Normal speech. Normal thought content and judgment.   Assessment/plan: Anna Callahan is a 32 y.o. female present for establishment.  Screening for diabetes mellitus - HgB A1c Long term (current) use of hormonal contraceptives - Lipid panel Encounter for long-term (current) use of medications - CBC w/Diff - Comp Met (CMET) Encounter for preventive health examination Patient was encouraged to exercise greater than 150 minutes a week. Patient was encouraged to choose a diet filled with fresh fruits and vegetables, and lean meats. AVS provided to patient today for education/recommendation on gender specific health and safety  maintenance. Colonoscopy: No fhx, screen at 75 Mammogram: no fhx, screen at 39, has GYN Cervical cancer screening: last pap: 02/2017, Dr. Philis Pique (GYN) results requested Immunizations: tdap UTD 2015, Influenza UTD 2018  Infectious disease screening: HIV completed DEXA: N/A    Return in about 1 year (around 09/07/2018) for CPE.   Note is dictated utilizing voice recognition software. Although note has been proof read prior to signing, occasional typographical errors still can be missed. If any questions arise, please do not hesitate to call for verification.  Electronically signed by: Howard Pouch, DO [place]

## 2017-10-11 MED FILL — MONTELUKAST SOD 10 MG TAB: 10 | 30 days supply | Qty: 30 | Fill #1

## 2017-10-11 MED FILL — FEMYNOR 0.25-35 MG-MCG TABS: 0.25-35 | 84 days supply | Qty: 84 | Fill #1

## 2017-10-29 DIAGNOSIS — J019 Acute sinusitis, unspecified: Secondary | ICD-10-CM | POA: Diagnosis not present

## 2017-10-29 DIAGNOSIS — J3089 Other allergic rhinitis: Secondary | ICD-10-CM | POA: Diagnosis not present

## 2017-10-29 DIAGNOSIS — J069 Acute upper respiratory infection, unspecified: Secondary | ICD-10-CM | POA: Diagnosis not present

## 2017-12-01 MED FILL — MONTELUKAST SOD 10 MG TAB: 10 | 30 days supply | Qty: 30 | Fill #2

## 2017-12-27 DIAGNOSIS — J45901 Unspecified asthma with (acute) exacerbation: Secondary | ICD-10-CM | POA: Diagnosis not present

## 2018-01-02 DIAGNOSIS — J069 Acute upper respiratory infection, unspecified: Secondary | ICD-10-CM | POA: Diagnosis not present

## 2018-01-03 ENCOUNTER — Ambulatory Visit: Payer: 59 | Admitting: Family Medicine

## 2018-01-11 MED FILL — FEMYNOR 0.25-35 MG-MCG TABS: 0.25-35 | 84 days supply | Qty: 84 | Fill #2

## 2018-01-11 MED FILL — MONTELUKAST SOD 10 MG TAB: 10 | 30 days supply | Qty: 30 | Fill #3

## 2018-02-22 MED FILL — MONTELUKAST SOD 10 MG TAB: 10 | 90 days supply | Qty: 90 | Fill #4

## 2018-04-06 ENCOUNTER — Ambulatory Visit: Payer: 59 | Admitting: Family Medicine

## 2018-04-06 ENCOUNTER — Ambulatory Visit: Payer: Self-pay

## 2018-04-06 ENCOUNTER — Encounter: Payer: Self-pay | Admitting: Family Medicine

## 2018-04-06 VITALS — BP 115/71 | HR 82 | Temp 97.5°F | Resp 16 | Ht 63.0 in | Wt 158.0 lb

## 2018-04-06 DIAGNOSIS — J4531 Mild persistent asthma with (acute) exacerbation: Secondary | ICD-10-CM | POA: Diagnosis not present

## 2018-04-06 DIAGNOSIS — J4521 Mild intermittent asthma with (acute) exacerbation: Secondary | ICD-10-CM

## 2018-04-06 MED ORDER — ALBUTEROL SULFATE HFA 108 (90 BASE) MCG/ACT IN AERS: 2.0000 | INHALATION_SPRAY | Freq: Four times a day (QID) | RESPIRATORY_TRACT | 0 refills | Status: DC | PRN

## 2018-04-06 MED ORDER — IPRATROPIUM-ALBUTEROL 0.5-2.5 (3) MG/3ML IN SOLN
3.0000 mL | Freq: Once | RESPIRATORY_TRACT | Status: AC
  Administered 2018-04-06: 3 mL via RESPIRATORY_TRACT

## 2018-04-06 MED ORDER — PREDNISONE 50 MG PO TABS: 50.0000 mg | ORAL_TABLET | Freq: Every day | ORAL | 0 refills | Status: DC

## 2018-04-06 MED ORDER — DOXYCYCLINE HYCLATE 100 MG PO TABS: 100.0000 mg | ORAL_TABLET | Freq: Two times a day (BID) | ORAL | 0 refills | Status: DC

## 2018-04-06 MED ORDER — METHYLPREDNISOLONE ACETATE 80 MG/ML IJ SUSP
80.0000 mg | Freq: Once | INTRAMUSCULAR | Status: AC
  Administered 2018-04-06: 80 mg via INTRAMUSCULAR

## 2018-04-06 MED ORDER — FLUTICASONE-SALMETEROL 250-50 MCG/DOSE IN AEPB: 1.0000 | INHALATION_SPRAY | Freq: Two times a day (BID) | RESPIRATORY_TRACT | 3 refills | Status: DC

## 2018-04-06 MED FILL — FEMYNOR 0.25-35 MG-MCG TABS: 0.25-35 | 84 days supply | Qty: 84 | Fill #3

## 2018-04-06 MED FILL — predniSONE 50 MG TABS: 50 | 3 days supply | Qty: 3 | Fill #0

## 2018-04-06 MED FILL — ADVAIR 250/50 DISKUS: 250-50 | 30 days supply | Qty: 60 | Fill #0

## 2018-04-06 MED FILL — VENTOLIN HFA 90 MCG INHALER: 108 (90 BAS | 25 days supply | Qty: 18 | Fill #0

## 2018-04-06 MED FILL — DOXYCYCLINE HYCLATE 100 MG: 100 | 7 days supply | Qty: 14 | Fill #0

## 2018-04-06 NOTE — Telephone Encounter (Signed)
Pt. Reports she started coughing yesterday. Non-productive, "but I feel congested." Has wheezing and runny nose. No fever. Has asthma and has been using her inhaler. Appointment made for this morning.  Reason for Disposition . SEVERE coughing spells (e.g., whooping sound after coughing, vomiting after coughing)  Answer Assessment - Initial Assessment Questions 1. ONSET: "When did the cough begin?"      Yesterday 2. SEVERITY: "How bad is the cough today?"      Moderate 3. RESPIRATORY DISTRESS: "Describe your breathing."      Shortness of breath 4. FEVER: "Do you have a fever?" If so, ask: "What is your temperature, how was it measured, and when did it start?"     No 5. HEMOPTYSIS: "Are you coughing up any blood?" If so ask: "How much?" (flecks, streaks, tablespoons, etc.)     No 6. TREATMENT: "What have you done so far to treat the cough?" (e.g., meds, fluids, humidifier)     Inhaler 7. CARDIAC HISTORY: "Do you have any history of heart disease?" (e.g., heart attack, congestive heart failure)      No 8. LUNG HISTORY: "Do you have any history of lung disease?"  (e.g., pulmonary embolus, asthma, emphysema)     Asthma 9. PE RISK FACTORS: "Do you have a history of blood clots?" (or: recent major surgery, recent prolonged travel, bedridden)     No 10. OTHER SYMPTOMS: "Do you have any other symptoms? (e.g., runny nose, wheezing, chest pain)       Runny nose and wheezing 11. PREGNANCY: "Is there any chance you are pregnant?" "When was your last menstrual period?"       No 12. TRAVEL: "Have you traveled out of the country in the last month?" (e.g., travel history, exposures)       No  Protocols used: COUGH - ACUTE NON-PRODUCTIVE-A-AH

## 2018-04-06 NOTE — Patient Instructions (Signed)
Rest, hydrate.   mucinex (DM if cough) if needed IM depo medrol 80, duoneb treatment provided today Doxycycline start today and start prednisone Sunday for 3 days.  Albuterol inhaler (new one called in) use 1-2 puffs every 4 -6 hours as needed  Start Advair 1 puff every 12 hours daily to control asthma .  F/U 2 weeks of not improved.    Asthma Attack Prevention, Adult Although you may not be able to control the fact that you have asthma, you can take actions to prevent episodes of asthma (asthma attacks). These actions include:  Creating a written plan for managing and treating your asthma attacks (asthma action plan).  Monitoring your asthma.  Avoiding things that can irritate your airways or make your asthma symptoms worse (asthma triggers).  Taking your medicines as directed.  Acting quickly if you have signs or symptoms of an asthma attack. What are some ways to prevent an asthma attack? Create a plan Work with your health care provider to create an asthma action plan. This plan should include:  A list of your asthma triggers and how to avoid them.  A list of symptoms that you experience during an asthma attack.  Information about when to take medicine and how much medicine to take.  Information to help you understand your peak flow measurements.  Contact information for your health care providers.  Daily actions that you can take to control asthma. Monitor your asthma To monitor your asthma:  Use your peak flow meter every morning and every evening for 2-3 weeks. Record the results in a journal. A drop in your peak flow numbers on one or more days may mean that you are starting to have an asthma attack, even if you are not having symptoms.  When you have asthma symptoms, write them down in a journal.  Avoid asthma triggers Work with your health care provider to find out what your asthma triggers are. This can be done by:  Being tested for allergies.  Keeping a  journal that notes when asthma attacks occur and what may have contributed to them.  Asking your health care provider whether other medical conditions make your asthma worse. Common asthma triggers include:  Dust.  Smoke. This includes campfire smoke and secondhand smoke from tobacco products.  Pet dander.  Trees, grasses or pollens.  Very cold, dry, or humid air.  Mold.  Foods that contain high amounts of sulfites.  Strong smells.  Engine exhaust and air pollution.  Aerosol sprays and fumes from household cleaners.  Household pests and their droppings, including dust mites and cockroaches.  Certain medicines, including NSAIDs. Once you have determined your asthma triggers, take steps to avoid them. Depending on your triggers, you may be able to reduce the chance of an asthma attack by:  Keeping your home clean. Have someone dust and vacuum your home for you 1 or 2 times a week. If possible, have them use a high-efficiency particulate arrestance (HEPA) vacuum.  Washing your sheets weekly in hot water.  Using allergy-proof mattress covers and casings on your bed.  Keeping pets out of your home.  Taking care of mold and water problems in your home.  Avoiding areas where people smoke.  Avoiding using strong perfumes or odor sprays.  Avoid spending a lot of time outdoors when pollen counts are high and on very windy days.  Talking with your health care provider before stopping or starting any new medicines. Medicines Take over-the-counter and prescription medicines only  as told by your health care provider. Many asthma attacks can be prevented by carefully following your medicine schedule. Taking your medicines correctly is especially important when you cannot avoid certain asthma triggers. Even if you are doing well, do not stop taking your medicine and do not take less medicine. Act quickly If an asthma attack happens, acting quickly can decrease how severe it is and  how long it lasts. Take these actions:  Pay attention to your symptoms. If you are coughing, wheezing, or having difficulty breathing, do not wait to see if your symptoms go away on their own. Follow your asthma action plan.  If you have followed your asthma action plan and your symptoms are not improving, call your health care provider or seek immediate medical care at the nearest hospital. It is important to write down how often you need to use your fast-acting rescue inhaler. You can track how often you use an inhaler in your journal. If you are using your rescue inhaler more often, it may mean that your asthma is not under control. Adjusting your asthma treatment plan may help you to prevent future asthma attacks and help you to gain better control of your condition. How can I prevent an asthma attack when I exercise? Exercise is a common asthma trigger. To prevent asthma attacks during exercise:  Follow advice from your health care provider about whether you should use your fast-acting inhaler before exercising. Many people with asthma experience exercise-induced bronchoconstriction (EIB). This condition often worsens during vigorous exercise in cold, humid, or dry environments. Usually, people with EIB can stay very active by using a fast-acting inhaler before exercising.  Avoid exercising outdoors in very cold or humid weather.  Avoid exercising outdoors when pollen counts are high.  Warm up and cool down when exercising.  Stop exercising right away if asthma symptoms start. Consider taking part in exercises that are less likely to cause asthma symptoms such as:  Indoor swimming.  Biking.  Walking.  Hiking.  Playing football. This information is not intended to replace advice given to you by your health care provider. Make sure you discuss any questions you have with your health care provider. Document Released: 03/03/2009 Document Revised: 11/14/2015 Document Reviewed:  08/30/2015 Elsevier Interactive Patient Education  2019 [employer].

## 2018-04-06 NOTE — Progress Notes (Signed)
Anna Callahan , October 01, 1985, 33 y.o., female MRN: [id number] Patient Care Team    Relationship Specialty Notifications Start End  Natalia Leatherwood, DO PCP - General Family Medicine  09/06/17   Carrington Clamp, MD Consulting Physician Obstetrics and Gynecology  09/06/17   Alfonse Spruce, MD Consulting Physician Allergy and Immunology  09/06/17     Chief Complaint  Patient presents with  . Cough    x2 days  . Wheezing  . Shortness of Breath     Subjective: Pt presents for an OV with complaints of cough of 2 days duration.  Associated symptoms include wheezing, chills and nasal congestion. She has intermittent asthma. She has used her albuterol a few times over the last few days and even waking in the night with symptoms. She denies fever, chills, nausea, diarrhea or headache.  Son was sick, her daughter sick.  No flowsheet data found.  No Known Allergies Social History   Tobacco Use  . Smoking status: Never Smoker  . Smokeless tobacco: Never Used  Substance Use Topics  . Alcohol use: Yes    Comment: occasional   Past Medical History:  Diagnosis Date  . Asthma    Past Surgical History:  Procedure Laterality Date  . WISDOM TOOTH EXTRACTION Bilateral    Family History  Problem Relation Age of Onset  . Hyperlipidemia Mother   . Hyperlipidemia Father   . Hyperlipidemia Maternal Grandmother   . Arthritis Maternal Grandmother   . Hyperlipidemia Maternal Grandfather   . Hearing loss Maternal Grandfather   . Hypertension Maternal Grandfather   . Stroke Maternal Grandfather   . Arthritis Paternal Grandmother   . Hearing loss Paternal Grandmother   . Diabetes Paternal Grandfather    Allergies as of 04/06/2018   No Known Allergies     Medication List       Accurate as of April 06, 2018 11:59 AM. Always use your most recent med list.        albuterol 108 (90 Base) MCG/ACT inhaler Commonly known as:  PROAIR HFA Inhale 2 puffs into the lungs every 4 (four)  hours as needed for wheezing or shortness of breath.   albuterol 108 (90 Base) MCG/ACT inhaler Commonly known as:  PROVENTIL HFA;VENTOLIN HFA Inhale 2 puffs into the lungs every 6 (six) hours as needed for wheezing or shortness of breath.   CLARITIN 10 MG Caps Generic drug:  Loratadine Take by mouth.   dextromethorphan-guaiFENesin 30-600 MG 12hr tablet Commonly known as:  MUCINEX DM Take 1 tablet by mouth 2 (two) times daily.   doxycycline 100 MG tablet Commonly known as:  VIBRA-TABS Take 1 tablet (100 mg total) by mouth 2 (two) times daily.   FEMYNOR 0.25-35 MG-MCG tablet Generic drug:  norgestimate-ethinyl estradiol   Fluticasone-Salmeterol 250-50 MCG/DOSE Aepb Commonly known as:  ADVAIR DISKUS Inhale 1 puff into the lungs 2 (two) times daily.   montelukast 10 MG tablet Commonly known as:  SINGULAIR Take 1 tablet (10 mg total) by mouth at bedtime.   predniSONE 50 MG tablet Commonly known as:  DELTASONE Take 1 tablet (50 mg total) by mouth daily with breakfast. Start taking on:  April 09, 2018       All past medical history, surgical history, allergies, family history, immunizations andmedications were updated in the EMR today and reviewed under the history and medication portions of their EMR.     ROS: Negative, with the exception of above mentioned in HPI   Objective:  BP  115/71 (BP Location: Right Arm, Patient Position: Sitting, Cuff Size: Normal)   Pulse 82   Temp (!) 97.5 F (36.4 C) (Oral)   Resp 16   Ht 5\' 3"  (1.6 m)   Wt 158 lb (71.7 kg)   SpO2 95%   BMI 27.99 kg/m  Body mass index is 27.99 kg/m. Gen: Afebrile. No acute distress. Nontoxic in appearance, well developed, well nourished.  HENT: AT. [place]. Bilateral TM visualized without erythema. MMM, no oral lesions. Bilateral nares with mild erythema, swelling and drainage. Throat without erythema or exudates. Mild cough.  Eyes:Pupils Equal Round Reactive to light, Extraocular movements intact,   Conjunctiva without redness, discharge or icterus. Neck/lymp/endocrine: Supple,no lymphadenopathy CV: RRR , noedema Chest: moderate diffuse wheezing.  Good air movement, normal resp effort.  Skin: no rashes, purpura or petechiae.  Neuro:  Normal gait. PERLA. EOMi. Alert. Oriented x3  No exam data present No results found. No results found for this or any previous visit (from the past 24 hour(s)).  Assessment/Plan: Anna Callahan is a 33 y.o. female present for OV for  Mild persistent asthma with acute exacerbation Rest, hydrate.   mucinex (DM if cough) IM depo medrol 80, duoneb treatment Work note for today Doxycycline start today and start prednisone Sunday for 3 days.  Albuterol inhaler use 1-2 puffs every 4 -6 hours as needed  Start Advair 1 puff every 12 hours daily to control asthma .  F/U 2 weeks of not improved.     Reviewed expectations re: course of current medical issues.  Discussed self-management of symptoms.  Outlined signs and symptoms indicating need for more acute intervention.  Patient verbalized understanding and all questions were answered.  Patient received an After-Visit Summary.    No orders of the defined types were placed in this encounter.    Note is dictated utilizing voice recognition software. Although note has been proof read prior to signing, occasional typographical errors still can be missed. If any questions arise, please do not hesitate to call for verification.   electronically signed by:  Felix Pacinienee Vaida Kerchner, DO  [facility] [place]

## 2018-04-12 DIAGNOSIS — H5213 Myopia, bilateral: Secondary | ICD-10-CM | POA: Diagnosis not present

## 2018-05-19 MED FILL — ADVAIR 250/50 DISKUS: 250-50 | 30 days supply | Qty: 60 | Fill #1 | Status: TO

## 2018-06-13 MED FILL — MONTELUKAST SOD 10 MG TAB: 10 | 30 days supply | Qty: 30 | Fill #5 | Status: TO

## 2018-06-29 MED FILL — NORETHIN-ESTRAD-FERR 1-0.02: 1-20 | 28 days supply | Qty: 28 | Fill #0

## 2018-07-13 MED FILL — MONTELUKAST SOD 10 MG TAB: 10 | 30 days supply | Qty: 30 | Fill #0

## 2018-07-15 ENCOUNTER — Other Ambulatory Visit: Payer: Self-pay | Admitting: Family Medicine

## 2018-07-27 MED FILL — FEMYNOR 0.25-35 MG-MCG TABS: 0.25-35 | 28 days supply | Qty: 28 | Fill #0 | Status: TO

## 2018-08-17 ENCOUNTER — Other Ambulatory Visit: Payer: Self-pay | Admitting: Family Medicine

## 2018-08-30 ENCOUNTER — Other Ambulatory Visit: Payer: Self-pay | Admitting: Allergy & Immunology

## 2018-08-30 ENCOUNTER — Telehealth: Payer: Self-pay | Admitting: Allergy & Immunology

## 2018-08-30 MED FILL — FEMYNOR 0.25-35 MG-MCG TABS: 0.25-35 | 28 days supply | Qty: 28 | Fill #0

## 2018-08-30 NOTE — Telephone Encounter (Signed)
Spoke to patient advised we sent in refill today and it was a courtesy refill she would need an appt for further refills. Patient verbalized understanding states she will reach out to pcp and she if they can prescribe medication for her as it it more expensive for her to come in to see [place].

## 2018-08-30 NOTE — Telephone Encounter (Signed)
Gave 1 courtesy refill of montelukast- pt needs apt.  

## 2018-08-30 NOTE — Telephone Encounter (Signed)
Patient needs a refill on montelukast called [facility]

## 2018-08-31 MED FILL — MONTELUKAST SOD 10 MG TAB: 10 | 30 days supply | Qty: 30 | Fill #0

## 2018-09-28 MED FILL — FEMYNOR 0.25-35 MG-MCG TABS: 0.25-35 | 28 days supply | Qty: 28 | Fill #1

## 2018-10-03 ENCOUNTER — Encounter: Payer: Self-pay | Admitting: Family Medicine

## 2018-10-13 ENCOUNTER — Ambulatory Visit (INDEPENDENT_AMBULATORY_CARE_PROVIDER_SITE_OTHER): Payer: Managed Care, Other (non HMO) | Admitting: Family Medicine

## 2018-10-13 ENCOUNTER — Encounter: Payer: Self-pay | Admitting: Family Medicine

## 2018-10-13 ENCOUNTER — Other Ambulatory Visit: Payer: Self-pay

## 2018-10-13 VITALS — Temp 97.7°F | Wt 156.0 lb

## 2018-10-13 DIAGNOSIS — J45909 Unspecified asthma, uncomplicated: Secondary | ICD-10-CM | POA: Insufficient documentation

## 2018-10-13 DIAGNOSIS — J302 Other seasonal allergic rhinitis: Secondary | ICD-10-CM | POA: Diagnosis not present

## 2018-10-13 DIAGNOSIS — J4530 Mild persistent asthma, uncomplicated: Secondary | ICD-10-CM

## 2018-10-13 DIAGNOSIS — J4521 Mild intermittent asthma with (acute) exacerbation: Secondary | ICD-10-CM | POA: Diagnosis not present

## 2018-10-13 MED ORDER — MONTELUKAST SODIUM 10 MG PO TABS: 10.0000 mg | ORAL_TABLET | Freq: Every day | ORAL | 3 refills | Status: DC

## 2018-10-13 MED ORDER — ALBUTEROL SULFATE HFA 108 (90 BASE) MCG/ACT IN AERS: 2.0000 | INHALATION_SPRAY | Freq: Four times a day (QID) | RESPIRATORY_TRACT | 5 refills | Status: AC | PRN

## 2018-10-13 NOTE — Progress Notes (Signed)
VIRTUAL VISIT VIA VIDEO  I connected with Anna Callahan on 10/13/18 at  2:30 PM EDT by a video enabled telemedicine application and verified that I am speaking with the correct person using two identifiers. Location patient: Home Location provider: [facility] [place], Office Persons participating in the virtual visit: Patient, Dr. Claiborne BillingsKuneff and R.Baker, LPN  I discussed the limitations of evaluation and management by telemedicine and the availability of in person appointments. The patient expressed understanding and agreed to proceed.  Patient Care Team    Relationship Specialty Notifications Start End  Natalia LeatherwoodKuneff, Reed Eifert A, DO PCP - General Family Medicine  09/06/17   Carrington ClampHorvath, Michelle, MD Consulting Physician Obstetrics and Gynecology  09/06/17   Alfonse SpruceGallagher, Joel Louis, MD Consulting Physician Allergy and Immunology  09/06/17      SUBJECTIVE Chief Complaint  Patient presents with  . Medication Refill    singular refill    HPI: Anna Buttemily Callahan is a 33 y.o. female present for follow up on her allergies and asthma.  She reports compliance with daily Wixela.  She rarely uses her albuterol inhaler.  Her asthma and allergies are well controlled with over-the-counter Allegra, prescribed Singulair and prescribed inhalers.  She did have a sinus infection last month and was treated with Augmentin by another provider.   ROS: See pertinent positives and negatives per HPI.  Patient Active Problem List   Diagnosis Date Noted  . Long term (current) use of hormonal contraceptives 09/06/2017  . Overweight (BMI 25.0-29.9) 09/06/2017  . Encounter for preventive health examination 09/06/2017  . Mild intermittent asthma with acute exacerbation 10/13/2016  . Other seasonal allergic rhinitis 10/13/2016    Social History   Tobacco Use  . Smoking status: Never Smoker  . Smokeless tobacco: Never Used  Substance Use Topics  . Alcohol use: Yes    Comment: occasional    Current Outpatient Medications:  .   albuterol (PROAIR HFA) 108 (90 Base) MCG/ACT inhaler, Inhale 2 puffs into the lungs every 4 (four) hours as needed for wheezing or shortness of breath., Disp: 1 Inhaler, Rfl: 1 .  albuterol (PROVENTIL HFA;VENTOLIN HFA) 108 (90 Base) MCG/ACT inhaler, Inhale 2 puffs into the lungs every 6 (six) hours as needed for wheezing or shortness of breath., Disp: 1 Inhaler, Rfl: 0 .  FEMYNOR 0.25-35 MG-MCG tablet, , Disp: , Rfl:  .  fexofenadine (ALLEGRA ALLERGY) 180 MG tablet, Take 180 mg by mouth daily., Disp: , Rfl:  .  montelukast (SINGULAIR) 10 MG tablet, TAKE 1 TABLET BY MOUTH AT BEDTIME., Disp: 30 tablet, Rfl: 0 .  WIXELA INHUB 250-50 MCG/DOSE AEPB, INHALE 1 PUFF BY MOUTH INTO THE LUNGS TWICE A DAY, Disp: 60 each, Rfl: 11 .  dextromethorphan-guaiFENesin (MUCINEX DM) 30-600 MG 12hr tablet, Take 1 tablet by mouth 2 (two) times daily., Disp: , Rfl:  .  doxycycline (VIBRA-TABS) 100 MG tablet, Take 1 tablet (100 mg total) by mouth 2 (two) times daily. (Patient not taking: Reported on 10/13/2018), Disp: 14 tablet, Rfl: 0 .  Loratadine (CLARITIN) 10 MG CAPS, Take by mouth., Disp: , Rfl:  .  predniSONE (DELTASONE) 50 MG tablet, Take 1 tablet (50 mg total) by mouth daily with breakfast. (Patient not taking: Reported on 10/13/2018), Disp: 3 tablet, Rfl: 0  No Known Allergies  OBJECTIVE: Temp 97.7 F (36.5 C) (Oral)   Wt 156 lb (70.8 kg)   LMP 09/22/2018 (Exact Date)   Breastfeeding No   BMI 27.63 kg/m  Gen: No acute distress. Nontoxic in appearance.  HENT: AT. [place].  MMM.  Eyes:Pupils Equal Round Reactive to light, Extraocular movements intact,  Conjunctiva without redness, discharge or icterus. Chest: Cough or shortness of breath not present Skin: no rashes, purpura or petechiae.  Neuro:  Normal gait. Alert. Oriented x3  Psych: Normal affect, dress and demeanor. Normal speech. Normal thought content and judgment.  ASSESSMENT AND PLAN: Anna Callahan is a 33 y.o. female present for  Other seasonal  allergic rhinitis/Mild persistent asthma, unspecified whether complicated -Continue Wixela- refills not needed.  -Continue albuterol inhaler as needed, refills provided to her today. -Continue Singulair 10 mg daily, refills provided today. - Continue Allegra -Follow-up yearly, can be at the time of her CPE as long as condition is well controlled.  Sooner if symptoms are worsening.  > 15 minutes spent with patient, > 50% of that time face to face  Howard Pouch, DO 10/13/2018

## 2018-10-26 MED FILL — FEMYNOR 0.25-35 MG-MCG TABS: 0.25-35 | 28 days supply | Qty: 28 | Fill #2

## 2018-11-08 ENCOUNTER — Ambulatory Visit: Payer: Managed Care, Other (non HMO) | Admitting: Family Medicine

## 2019-02-14 ENCOUNTER — Other Ambulatory Visit: Payer: Self-pay

## 2019-02-15 ENCOUNTER — Encounter: Payer: Self-pay | Admitting: Family Medicine

## 2019-02-15 ENCOUNTER — Ambulatory Visit (INDEPENDENT_AMBULATORY_CARE_PROVIDER_SITE_OTHER): Payer: Managed Care, Other (non HMO) | Admitting: Family Medicine

## 2019-02-15 VITALS — BP 102/68 | HR 65 | Temp 98.2°F | Resp 16 | Ht 63.0 in | Wt 138.1 lb

## 2019-02-15 DIAGNOSIS — R109 Unspecified abdominal pain: Secondary | ICD-10-CM | POA: Diagnosis not present

## 2019-02-15 DIAGNOSIS — N926 Irregular menstruation, unspecified: Secondary | ICD-10-CM

## 2019-02-15 DIAGNOSIS — Z3200 Encounter for pregnancy test, result unknown: Secondary | ICD-10-CM

## 2019-02-15 DIAGNOSIS — L659 Nonscarring hair loss, unspecified: Secondary | ICD-10-CM

## 2019-02-15 LAB — CBC WITH DIFFERENTIAL/PLATELET
Basophils Absolute: 0.1 10*3/uL (ref 0.0–0.1)
Basophils Relative: 1.8 % (ref 0.0–3.0)
Eosinophils Absolute: 0.3 10*3/uL (ref 0.0–0.7)
Eosinophils Relative: 5.9 % — ABNORMAL HIGH (ref 0.0–5.0)
HCT: 40.7 % (ref 36.0–46.0)
Hemoglobin: 13.4 g/dL (ref 12.0–15.0)
Lymphocytes Relative: 26.8 % (ref 12.0–46.0)
Lymphs Abs: 1.5 10*3/uL (ref 0.7–4.0)
MCHC: 32.8 g/dL (ref 30.0–36.0)
MCV: 89.1 fl (ref 78.0–100.0)
Monocytes Absolute: 0.5 10*3/uL (ref 0.1–1.0)
Monocytes Relative: 8.9 % (ref 3.0–12.0)
Neutro Abs: 3.1 10*3/uL (ref 1.4–7.7)
Neutrophils Relative %: 56.6 % (ref 43.0–77.0)
Platelets: 267 10*3/uL (ref 150.0–400.0)
RBC: 4.57 Mil/uL (ref 3.87–5.11)
RDW: 13.6 % (ref 11.5–15.5)
WBC: 5.5 10*3/uL (ref 4.0–10.5)

## 2019-02-15 LAB — TSH: TSH: 0.66 u[IU]/mL (ref 0.35–4.50)

## 2019-02-15 LAB — VITAMIN D 25 HYDROXY (VIT D DEFICIENCY, FRACTURES): VITD: 42.99 ng/mL (ref 30.00–100.00)

## 2019-02-15 LAB — HCG, QUANTITATIVE, PREGNANCY: Quantitative HCG: <0.6 m[IU]/mL

## 2019-02-15 LAB — T4, FREE: Free T4: 1.1 ng/dL (ref 0.60–1.60)

## 2019-02-15 NOTE — Patient Instructions (Signed)
We will call you once we get your labs and discuss further plan.  For now stay with one hair product.  Avoid tight ponytail.

## 2019-02-15 NOTE — Progress Notes (Signed)
Anna Callahan , January 21, 1986, 33 y.o., female MRN: [id number] Patient Care Team    Relationship Specialty Notifications Start End  Anna Leatherwood, DO PCP - General Family Medicine  09/06/17   Anna Clamp, MD Consulting Physician Obstetrics and Gynecology  09/06/17   Anna Spruce, MD Consulting Physician Allergy and Immunology  09/06/17     Chief Complaint  Patient presents with  . Alopecia    Pt is has been having hair loss in clumps x2 weeks. Pt and husband have been trying to concieve. She is under stress and does believe she has had a miscarriage      Subjective: Pt presents for an OV with complaints of increased hair loss of 2 weeks duration.  Associated symptoms include abnormal menstrual flow.  Patient reports over the last 2 weeks she had noticed clumps of hair falling out when washing her hair.  Her hairdresser also noticed thinning of her hair.  She reports there is an area from right temple area that is extremely thin and has her concerned.  She does wear a ponytail daily.  She denies any recent trauma or surgery.  She is under more stress secondary to pandemic and balancing job/home life.  Patient denies any changes in personal care products/haircare products. She also endorses a change in her menstrual flow.  She reports she and her husband are trying to get pregnant.  She stopped the birth control approximately 3 months ago after her August cycle.  Her periods are regular after stopping the birth control pill until 1 week prior to her last menstrual start date she had rather severe cramping, heavier bleeding to the point she almost went to the urgent care.  He bled for a few days, some of which she thought she saw a "tissue "and then the bleeding abruptly started.  She reports her period then did start except for it was 2 days late.Patient's last menstrual period was 02/12/2019 (exact date).   No flowsheet data found.  No Known Allergies Social History   Social  History Narrative   Married, 2 children.    Master's degree, PT.    Exercise routinely.    Smoke alarm in the home.    Wears seatbelt.    Feels safe in her relationships.    Past Medical History:  Diagnosis Date  . Asthma    Past Surgical History:  Procedure Laterality Date  . WISDOM TOOTH EXTRACTION Bilateral    Family History  Problem Relation Age of Onset  . Hyperlipidemia Mother   . Hyperlipidemia Father   . Hyperlipidemia Maternal Grandmother   . Arthritis Maternal Grandmother   . Hyperlipidemia Maternal Grandfather   . Hearing loss Maternal Grandfather   . Hypertension Maternal Grandfather   . Stroke Maternal Grandfather   . Arthritis Paternal Grandmother   . Hearing loss Paternal Grandmother   . Diabetes Paternal Grandfather    Allergies as of 02/15/2019   No Known Allergies     Medication List       Accurate as of February 15, 2019 11:59 PM. If you have any questions, ask your nurse or doctor.        STOP taking these medications   Femynor 0.25-35 MG-MCG tablet Generic drug: norgestimate-ethinyl estradiol Stopped by: Anna Pacini, DO     TAKE these medications   albuterol 108 (90 Base) MCG/ACT inhaler Commonly known as: VENTOLIN HFA Inhale 2 puffs into the lungs every 6 (six) hours as needed for wheezing or shortness  of breath.   Allegra Allergy 180 MG tablet Generic drug: fexofenadine Take 180 mg by mouth daily.   montelukast 10 MG tablet Commonly known as: SINGULAIR Take 1 tablet (10 mg total) by mouth at bedtime.   Wixela Inhub 250-50 MCG/DOSE Aepb Generic drug: Fluticasone-Salmeterol INHALE 1 PUFF BY MOUTH INTO THE LUNGS TWICE A DAY       All past medical history, surgical history, allergies, family history, immunizations andmedications were updated in the EMR today and reviewed under the history and medication portions of their EMR.     ROS: Negative, with the exception of above mentioned in HPI   Objective:  BP 102/68 (BP  Location: Right Arm, Patient Position: Sitting, Cuff Size: Normal)   Pulse 65   Temp 98.2 F (36.8 C) (Temporal)   Resp 16   Ht 5\' 3"  (1.6 m)   Wt 138 lb 2 oz (62.7 kg)   LMP 02/12/2019 (Exact Date)   SpO2 98%   BMI 24.47 kg/m  Body mass index is 24.47 kg/m. Gen: Afebrile. No acute distress. Nontoxic in appearance, well developed, well nourished.  HENT: AT. [place].  Hair appears healthy.  Very minimal dry areas or flakiness of scalp.  No obvious sitting patches, with the exception of right temple area approximately the size of a dime she has many broken hairs in this location about an inch long.  No lesions on her scalp. Eyes:Pupils Equal Round Reactive to light, Extraocular movements intact,  Conjunctiva without redness, discharge or icterus.  No exam data present No results found. No results found for this or any previous visit (from the past 24 hour(s)).  Assessment/Plan: Anna Callahan is a 33 y.o. female present for OV for   Abnormal menses/abdominal cramping/possible missed pregnancy -We will obtain beta hCG quant and thyroid panel today.  Possible change in menses secondary to early miscarriage versus thyroid disorder.  Her hair loss and abnormal menses did coincide.  Could be secondary to iron deficiency if heavy bleeding, thyroid disorder or stress. - B-HCG Quant - TSH - T4, free  Hair loss She does have an small area of thinning hair near her right temple area.  Possibly traction alopecia secondary to her tight ponytail.  I have encouraged her to try to not wear a ponytail for a while or at least not as tight.  We will rule out other causes of potential hair loss with thyroid disorders, vitamin D deficiency, iron deficiency or anemia. -Consider start of medication for scalp to stimulate regrowth if no other causes identified. - Vitamin D (25 hydroxy) - CBC w/Diff - Iron, TIBC and Ferritin Panel - TSH - T4, free -Follow-up dependent upon results.   Reviewed expectations  re: course of current medical issues.  Discussed self-management of symptoms.  Outlined signs and symptoms indicating need for more acute intervention.  Patient verbalized understanding and all questions were answered.  Patient received an After-Visit Summary.    Orders Placed This Encounter  Procedures  . B-HCG Quant  . Vitamin D (25 hydroxy)  . CBC w/Diff  . Iron, TIBC and Ferritin Panel  . TSH  . T4, free   This visit occurred during the SARS-CoV-2 public health emergency.  Safety protocols were in place, including screening questions prior to the visit, additional usage of staff PPE, and extensive cleaning of exam room while observing appropriate contact time as indicated for disinfecting solutions.   > 25 minutes spent with patient, >50% of time spent face to face  Note is dictated utilizing voice recognition software. Although note has been proof read prior to signing, occasional typographical errors still can be missed. If any questions arise, please do not hesitate to call for verification.   electronically signed by:  Howard Pouch, DO  [place]

## 2019-02-16 ENCOUNTER — Telehealth: Payer: Self-pay | Admitting: Family Medicine

## 2019-02-16 LAB — IRON,TIBC AND FERRITIN PANEL
%SAT: 28 % (calc) (ref 16–45)
Ferritin: 36 ng/mL (ref 16–154)
Iron: 87 ug/dL (ref 40–190)
TIBC: 315 mcg/dL (calc) (ref 250–450)

## 2019-02-16 MED ORDER — MINOXIDIL 5 % EX FOAM: CUTANEOUS | 2 refills | Status: AC

## 2019-02-16 NOTE — Telephone Encounter (Signed)
Please inform patient the following information: Her thyroid is functioning normal. Vitamin D, iron and blood counts are in normal range. Her quantitative serum hCG-which is the blood test levels of the pregnancy hormone was less than 0.  It typically takes a couple weeks for this to return to 0 after a miscarriage, although if she was in very far along it could take less time.  I would recommend she avoid wearing her hair back in a ponytail or any traction/pulling to her hair.  This could be caused from traction versus stress.  There are even some autoimmune causes-however her exam was not consistent with the autoimmune causes and unless very early stages.   I have called in the generic form of hair regrowth foam.  Is actually the generic form of Rogaine.  Apply half a capful to the area of thinned hair, and scalp daily.  May need to use for 3 to 4 months before notable thickening of hair.  If she observes more patchy baldness, I would recommend she come in to be seen and we would at that time need to refer her to dermatology for other types of treatment options.

## 2019-02-16 NOTE — Telephone Encounter (Signed)
Pt was called and given information/results, she verbalized understanding  

## 2019-02-20 ENCOUNTER — Encounter: Payer: Self-pay | Admitting: Family Medicine

## 2019-05-07 ENCOUNTER — Inpatient Hospital Stay (HOSPITAL_COMMUNITY)
Admission: AD | Admit: 2019-05-07 | Discharge: 2019-05-07 | Disposition: A | Discharge status: Home / Self Care | Payer: Managed Care, Other (non HMO) | Attending: Obstetrics and Gynecology | Admitting: Obstetrics and Gynecology

## 2019-05-07 ENCOUNTER — Other Ambulatory Visit: Payer: Self-pay

## 2019-05-07 DIAGNOSIS — N939 Abnormal uterine and vaginal bleeding, unspecified: Secondary | ICD-10-CM | POA: Diagnosis not present

## 2019-05-07 DIAGNOSIS — O039 Complete or unspecified spontaneous abortion without complication: Secondary | ICD-10-CM

## 2019-05-07 LAB — POCT PREGNANCY, URINE: Preg Test, Ur: NEGATIVE

## 2019-05-07 LAB — HCG, QUANTITATIVE, PREGNANCY: hCG, Beta Chain, Quant, S: 19 m[IU]/mL — ABNORMAL HIGH (ref <5)

## 2019-05-07 NOTE — MAU Note (Signed)
Husband arrived, in with pt

## 2019-05-07 NOTE — MAU Provider Note (Addendum)
Patient Anna Callahan is here for bleeding and cramping; she reports that she had two positive pregnancy tests at home. She is tearful and upset; she has been trying to get pregnant since August. Bleeding is light, cramping is occasional.   -UPT in MAU is negative; will order quant.  -quant is 19. Since patient is technically not had a confirmed IUP, will have follow up stat BHCG on Wednesday.  -spoke with [facility] scheduler, who will call patient to set up appointment.  -reviewed warning signs and when to return to MAU (bleeding, increase in pain, reassured her that we just want to make sure pregnancy is resolving).  -patient knows that she will receive a call from [facility] to schedule appointment.   Charlesetta Garibaldi Kitana Gage 05/07/2019, 10:44 AM

## 2019-05-07 NOTE — MAU Note (Signed)
Started having a little bit of bleeding on Sat night. Continued to see when she wiped yesterday and today, spotting on liner. Mild cramping here and there. Has not been seen in office yet with preg, called and they sent her here. +HPT  Wed/ Thu/ Fri.

## 2019-06-05 ENCOUNTER — Encounter: Payer: Self-pay | Admitting: Family Medicine

## 2019-07-04 ENCOUNTER — Encounter: Payer: Self-pay | Admitting: Family Medicine

## 2020-03-28 ENCOUNTER — Other Ambulatory Visit: Payer: Self-pay | Admitting: Family Medicine

## 2023-01-18 ENCOUNTER — Other Ambulatory Visit (HOSPITAL_BASED_OUTPATIENT_CLINIC_OR_DEPARTMENT_OTHER): Payer: Self-pay

## 2023-01-18 MED ORDER — INFLUENZA VIRUS VACC SPLIT PF (FLUZONE) 0.5 ML IM SUSY
0.5000 mL | PREFILLED_SYRINGE | Freq: Once | INTRAMUSCULAR | 0 refills | Status: AC
  Filled 2023-01-18: qty 0.5, 1d supply, fill #0

## 2024-01-03 ENCOUNTER — Other Ambulatory Visit (HOSPITAL_BASED_OUTPATIENT_CLINIC_OR_DEPARTMENT_OTHER): Payer: Self-pay

## 2024-01-03 MED ORDER — FLUZONE 0.5 ML IM SUSY
0.5000 mL | PREFILLED_SYRINGE | Freq: Once | INTRAMUSCULAR | 0 refills | Status: AC
  Filled 2024-01-03: qty 0.5, 1d supply, fill #0
